# Patient Record
Sex: Female | Born: 1965 | Race: Black or African American | Hispanic: No | Marital: Married | Smoking: Current every day smoker
Health system: Southern US, Community
[De-identification: ages and names within clinical notes are randomized; demographics above are authoritative.]

## PROBLEM LIST (undated history)

## (undated) DIAGNOSIS — A599 Trichomoniasis, unspecified: Secondary | ICD-10-CM

## (undated) DIAGNOSIS — R51 Headache: Secondary | ICD-10-CM

## (undated) DIAGNOSIS — M51369 Other intervertebral disc degeneration, lumbar region without mention of lumbar back pain or lower extremity pain: Secondary | ICD-10-CM

## (undated) HISTORY — PX: TUBAL LIGATION: SHX77

## (undated) HISTORY — PX: WISDOM TOOTH EXTRACTION: SHX21

---

## 1998-10-05 DIAGNOSIS — F329 Major depressive disorder, single episode, unspecified: Secondary | ICD-10-CM

## 2009-11-10 ENCOUNTER — Inpatient Hospital Stay (HOSPITAL_COMMUNITY): Admission: EM | Admit: 2009-11-10 | Discharge: 2009-11-13 | Payer: Self-pay | Admitting: Emergency Medicine

## 2009-11-12 ENCOUNTER — Encounter (INDEPENDENT_AMBULATORY_CARE_PROVIDER_SITE_OTHER): Payer: Self-pay | Admitting: Nurse Practitioner

## 2009-11-12 ENCOUNTER — Ambulatory Visit: Payer: Self-pay | Admitting: Oncology

## 2009-11-13 ENCOUNTER — Encounter (INDEPENDENT_AMBULATORY_CARE_PROVIDER_SITE_OTHER): Payer: Self-pay | Admitting: Nurse Practitioner

## 2009-11-13 ENCOUNTER — Ambulatory Visit: Payer: Self-pay | Admitting: Oncology

## 2009-11-17 DIAGNOSIS — J13 Pneumonia due to Streptococcus pneumoniae: Secondary | ICD-10-CM | POA: Insufficient documentation

## 2009-11-29 ENCOUNTER — Ambulatory Visit: Payer: Self-pay | Admitting: Nurse Practitioner

## 2009-11-29 DIAGNOSIS — D61818 Other pancytopenia: Secondary | ICD-10-CM | POA: Insufficient documentation

## 2009-11-29 DIAGNOSIS — F17200 Nicotine dependence, unspecified, uncomplicated: Secondary | ICD-10-CM | POA: Insufficient documentation

## 2009-12-02 ENCOUNTER — Encounter (INDEPENDENT_AMBULATORY_CARE_PROVIDER_SITE_OTHER): Payer: Self-pay | Admitting: Nurse Practitioner

## 2009-12-02 LAB — CONVERTED CEMR LAB
Albumin: 4.6 g/dL (ref 3.5–5.2)
Alkaline Phosphatase: 53 units/L (ref 39–117)
CO2: 26 meq/L (ref 19–32)
Calcium: 9.8 mg/dL (ref 8.4–10.5)
Chloride: 106 meq/L (ref 96–112)
Creatinine, Ser: 0.84 mg/dL (ref 0.40–1.20)
Hemoglobin: 12.7 g/dL (ref 12.0–15.0)
Lymphs Abs: 1.7 10*3/uL (ref 0.7–4.0)
MCHC: 34 g/dL (ref 30.0–36.0)
MCV: 93.5 fL (ref 78.0–100.0)
Neutro Abs: 0.6 10*3/uL — ABNORMAL LOW (ref 1.7–7.7)
Neutrophils Relative %: 20 % — ABNORMAL LOW (ref 43–77)
Platelets: 250 10*3/uL (ref 150–400)
RBC: 3.99 M/uL (ref 3.87–5.11)
RDW: 12.8 % (ref 11.5–15.5)
Sodium: 140 meq/L (ref 135–145)
Total Bilirubin: 0.5 mg/dL (ref 0.3–1.2)
WBC: 2.8 10*3/uL — ABNORMAL LOW (ref 4.0–10.5)

## 2009-12-25 ENCOUNTER — Ambulatory Visit: Payer: Self-pay | Admitting: Oncology

## 2009-12-26 ENCOUNTER — Encounter (INDEPENDENT_AMBULATORY_CARE_PROVIDER_SITE_OTHER): Payer: Self-pay | Admitting: Nurse Practitioner

## 2009-12-26 LAB — MORPHOLOGY

## 2009-12-26 LAB — CBC & DIFF AND RETIC
BASO%: 0.9 % (ref 0.0–2.0)
HGB: 12.1 g/dL (ref 11.6–15.9)
Immature Retic Fract: 3.1 % (ref 0.00–10.70)
MCHC: 35 g/dL (ref 31.5–36.0)
MONO#: 0.3 10*3/uL (ref 0.1–0.9)
MONO%: 8.8 % (ref 0.0–14.0)
NEUT#: 1.1 10*3/uL — ABNORMAL LOW (ref 1.5–6.5)
Platelets: 223 10*3/uL (ref 145–400)
Retic %: 1.11 % (ref 0.50–1.50)
WBC: 3.5 10*3/uL — ABNORMAL LOW (ref 3.9–10.3)
nRBC: 0 % (ref 0–0)

## 2009-12-26 LAB — LACTATE DEHYDROGENASE: LDH: 122 U/L (ref 94–250)

## 2009-12-30 ENCOUNTER — Encounter (INDEPENDENT_AMBULATORY_CARE_PROVIDER_SITE_OTHER): Payer: Self-pay | Admitting: Nurse Practitioner

## 2010-01-01 ENCOUNTER — Ambulatory Visit: Payer: Self-pay | Admitting: Internal Medicine

## 2010-01-01 ENCOUNTER — Encounter (INDEPENDENT_AMBULATORY_CARE_PROVIDER_SITE_OTHER): Payer: Self-pay | Admitting: Nurse Practitioner

## 2010-02-04 ENCOUNTER — Telehealth (INDEPENDENT_AMBULATORY_CARE_PROVIDER_SITE_OTHER): Payer: Self-pay | Admitting: Nurse Practitioner

## 2010-02-18 ENCOUNTER — Ambulatory Visit: Payer: Self-pay | Admitting: Nurse Practitioner

## 2010-02-18 DIAGNOSIS — A6000 Herpesviral infection of urogenital system, unspecified: Secondary | ICD-10-CM | POA: Insufficient documentation

## 2010-02-18 DIAGNOSIS — R519 Headache, unspecified: Secondary | ICD-10-CM | POA: Insufficient documentation

## 2010-02-18 DIAGNOSIS — K029 Dental caries, unspecified: Secondary | ICD-10-CM | POA: Insufficient documentation

## 2010-02-18 DIAGNOSIS — A599 Trichomoniasis, unspecified: Secondary | ICD-10-CM

## 2010-02-18 DIAGNOSIS — R51 Headache: Secondary | ICD-10-CM

## 2010-02-18 LAB — CONVERTED CEMR LAB
Glucose, Urine, Semiquant: NEGATIVE
KOH Prep: NEGATIVE
Ketones, urine, test strip: NEGATIVE
Nitrite: NEGATIVE
Rapid HIV Screen: NEGATIVE
Specific Gravity, Urine: >=1.03
pH: 5

## 2010-02-19 ENCOUNTER — Encounter (INDEPENDENT_AMBULATORY_CARE_PROVIDER_SITE_OTHER): Payer: Self-pay | Admitting: Nurse Practitioner

## 2010-02-19 LAB — CONVERTED CEMR LAB
Albumin: 4.4 g/dL (ref 3.5–5.2)
Basophils Absolute: 0 10*3/uL (ref 0.0–0.1)
CO2: 23 meq/L (ref 19–32)
Calcium: 8.8 mg/dL (ref 8.4–10.5)
Chlamydia, DNA Probe: NEGATIVE
Chloride: 105 meq/L (ref 96–112)
HDL: 48 mg/dL (ref >39)
LDL Cholesterol: 76 mg/dL (ref 0–99)
Lymphocytes Relative: 48 % — ABNORMAL HIGH (ref 12–46)
Lymphs Abs: 1.4 10*3/uL (ref 0.7–4.0)
MCHC: 32.8 g/dL (ref 30.0–36.0)
MCV: 94 fL (ref 78.0–100.0)
Neutro Abs: 1 10*3/uL — ABNORMAL LOW (ref 1.7–7.7)
Neutrophils Relative %: 35 % — ABNORMAL LOW (ref 43–77)
Platelets: 164 10*3/uL (ref 150–400)
Potassium: 3.8 meq/L (ref 3.5–5.3)
RBC: 4.02 M/uL (ref 3.87–5.11)
Sodium: 139 meq/L (ref 135–145)
TSH: 0.897 microintl units/mL (ref 0.350–4.500)
Total CHOL/HDL Ratio: 2.8
VLDL: 12 mg/dL (ref 0–40)
WBC: 2.9 10*3/uL — ABNORMAL LOW (ref 4.0–10.5)

## 2010-02-25 ENCOUNTER — Encounter (INDEPENDENT_AMBULATORY_CARE_PROVIDER_SITE_OTHER): Payer: Self-pay | Admitting: Nurse Practitioner

## 2010-03-06 ENCOUNTER — Ambulatory Visit: Payer: Self-pay | Admitting: Nurse Practitioner

## 2010-03-07 ENCOUNTER — Ambulatory Visit (HOSPITAL_COMMUNITY): Admission: RE | Admit: 2010-03-07 | Discharge: 2010-03-07 | Payer: Self-pay | Admitting: Internal Medicine

## 2010-03-18 ENCOUNTER — Ambulatory Visit: Payer: Self-pay | Admitting: Nurse Practitioner

## 2010-03-18 DIAGNOSIS — M5380 Other specified dorsopathies, site unspecified: Secondary | ICD-10-CM

## 2010-11-04 NOTE — Letter (Signed)
Summary: PT INFORMATION SHEET  PT INFORMATION SHEET   Imported By: Arta Bruce 01/28/2010 14:21:33  _____________________________________________________________________  External Attachment:    Type:   Image     Comment:   External Document

## 2010-11-04 NOTE — Progress Notes (Signed)
Summary: Office Visit//DEPRESSION SCREENING  Office Visit//DEPRESSION SCREENING   Imported By: Arta Bruce 04/11/2010 12:45:41  _____________________________________________________________________  External Attachment:    Type:   Image     Comment:   External Document

## 2010-11-04 NOTE — Letter (Signed)
Summary: RETASURE  RETASURE   Imported By: Arta Bruce 02/04/2010 09:25:00  _____________________________________________________________________  External Attachment:    Type:   Image     Comment:   External Document

## 2010-11-04 NOTE — Letter (Signed)
Summary: Handout Printed  Printed Handout:  - Genital Herpes 

## 2010-11-04 NOTE — Miscellaneous (Signed)
Summary: Dx update  Clinical Lists Changes  Problems: Changed problem from PANCYTOPENIA (ICD-284.1) to PANCYTOPENIA (ICD-284.1) - hematology consult:12/26/2009 - pancytopenia r/t viral illness/pneumonia and has recovered.  no need for f/u but will see again if necessary (Amy Allyson Sabal, PA-C)

## 2010-11-04 NOTE — Letter (Signed)
Summary: Handout Printed  Printed Handout:  - Headaches, Analgesic Rebound, NHF

## 2010-11-04 NOTE — Letter (Signed)
Summary: [facility]/OFFICE NOTE  [facility]/OFFICE NOTE   Imported By: Arta Bruce 02/27/2010 14:46:07  _____________________________________________________________________  External Attachment:    Type:   Image     Comment:   External Document

## 2010-11-04 NOTE — Letter (Signed)
Summary: Heather Donaldson'S SUMMARY  Heather Donaldson'S SUMMARY   Imported By: Arta Bruce 06/04/2010 15:55:14  _____________________________________________________________________  External Attachment:    Type:   Image     Comment:   External Document

## 2010-11-04 NOTE — Letter (Signed)
Summary: *HSN Results Follow up  [facility]  [address] [place], [place] [address]   Phone: [phone]  Fax: [phone]      12/02/2009   [name] [address] [place], [place]  [address]   Dear  Ms. Felton Clinton,                            ____S.Drinkard,FNP   ____D. Gore,FNP       ____B. McPherson,MD   ____V. Rankins,MD    ____E. Mulberry,MD    _X___N. Daphine Deutscher, FNP  ____D. Reche Dixon, MD    ____K. Philipp Deputy, MD    ____Other     This letter is to inform you that your recent test(s):  _______Pap Smear    ___X____Lab Test     _______X-ray   Comments:  Your white blood cells are still low.  Keep your appointment with the specialist in March as ordered while in the hospital.  it is very important to find out why your blood count is so low.       _________________________________________________________ If you have any questions, please contact our office [phone].                    Sincerely,    Lehman Prom FNP [facility]

## 2010-11-04 NOTE — Letter (Signed)
Summary: Handout Printed  Printed Handout:  - Trichomonas 

## 2010-11-04 NOTE — Assessment & Plan Note (Signed)
Summary: NEW - Establish Care   Vital Signs:  Patient profile:   45 year old female LMP:     11/27/2009 Height:      64.50 inches Weight:      142.1 pounds BMI:     24.10 BSA:     1.70 Temp:     98.0 degrees F oral Pulse rate:   101 / minute Pulse rhythm:   regular Resp:     20 per minute BP sitting:   102 / 69  (left arm) Cuff size:   regular  Vitals Entered By: Levon Hedger (November 29, 2009 3:28 PM) CC: follow-up visit Heather Donaldson Is Patient Diabetic? No Pain Assessment Patient in pain? no       Does patient need assistance? Functional Status Self care Ambulation Normal LMP (date): 11/27/2009     Enter LMP: 11/27/2009   CC:  follow-up visit Heather Donaldson.  History of Present Illness:  Pt into the office to establish care No previous PCP  PMH - none PSH - tubal ligation, c-section x 3  Hospitalized from 11/10/2009 to 11/13/2009 with pneumonia. On day of admission pt had diarrhea, cough, fatigue and fever Dx with lower lobe pneumonia - inpt treatment was rocephin and azithromycin.  pt was tapered to doxycycline which pt reports she has finished. Cough still lingers but fever has gone. No more nausea, vomiting, diarrhea D-dimer was positive in ER and CT done which was negative for PE but did confirm the Penumonia.  Pancytopenia - likely a sequelae of viral illness Pt made an appt with Dr. Raymond Gurney Magrinat - March 10th at 2:45pm WBC 1.9, plt 98, hgb 12.5 on admission and wbc 1.5 with plt 93 on day of d/c with no pcp for many years pt had no previous knowledge of this dx  Social - pt relocated from [facility], [place] and has not established with a PCP since mofing to this area  Habits & Providers  Alcohol-Tobacco-Diet     Alcohol drinks/day: 0     Tobacco Status: current     Tobacco Counseling: to quit use of tobacco products     Cigarette Packs/Day: 1.0     Year Started: age 79  Exercise-Depression-Behavior     Does Patient Exercise: no     Drug Use:  past  Comments: Previous cocaine abuse - quit 5 years ago  Medications Prior to Update: 1)  None  Allergies (verified): 1)  ! 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 Eyes:  Complains of blurring; needs an eye exam. Resp:  Complains of cough; denies shortness of breath and wheezing. GI:  Denies abdominal pain, nausea, and vomiting. GU:  Denies discharge.  Physical Exam  General:  alert.   Head:  normocephalic.   Mouth:  fair dentition.   Lungs:  normal breath sounds.   no rhonchi Heart:  normal rate and regular rhythm.   Abdomen:  normal bowel sounds.   Neurologic:  alert & oriented X3 and gait normal.     Impression & Recommendations:  Problem # 1:  Hx of PNEUMONIA, LEFT LOWER LOBE (ICD-481) improved pt may  take otc cough meds  Problem # 2:  PANCYTOPENIA (ICD-284.1) keep f/u with hematology Orders: T-Comprehensive Metabolic Panel ([id number]) T-CBC w/Diff ([address]11-91478)  Problem # 3:  TOBACCO ABUSE (ICD-305.1) advised cessation Orders: T-Comprehensive Metabolic Panel ([address]-13086) T-CBC w/Diff (57[address]-96295)  Patient Instructions: 1)  Schedule a retasure exam on March 25th - the same day as eligibility. 2)  Keep your appointment with the blood specialist to find out why your blood count and platelets are so low. 3)  Schedule an appointment for a complete physical exam when you get your orange card. 4)  Come fasting for that appointment - do not eat after midnight before this visit.  May consider meds for smoking cessation   CT of Chest  Procedure date:  11/10/2009  Findings:      no evidence of  pulmonary embolus left lower lobe pneumonia low density prominence of both adrenal glands likely reflecting adrenal hyperplasia

## 2010-11-04 NOTE — Assessment & Plan Note (Signed)
Summary: Headaches   Vital Signs:  Patient profile:   45 year old female Menstrual status:  irregular Weight:      144.0 pounds BMI:     24.42 Temp:     97.8 degrees F oral Pulse rate:   76 / minute Pulse rhythm:   regular Resp:     20 per minute BP sitting:   106 / 73  (left arm) Cuff size:   regular  Vitals Entered By: Levon Hedger (March 18, 2010 11:17 AM) CC: follow-up visit 4 weeks....back spasms x 1 day that is causing some discomfort, Depression Is Patient Diabetic? No Pain Assessment Patient in pain? no       Does patient need assistance? Functional Status Self care Ambulation Normal   CC:  follow-up visit 4 weeks....back spasms x 1 day that is causing some discomfort and Depression.  History of Present Illness:  Pt into the office for follow up on headaches Daughter present today with pt during visit.  Headaches - see previous note for full H&P This does not seem to be the top priority for the pt as she admits that she has NOT decreased her caffiene intake as ordered by the provider.   She does reports headaches have lessened since last visit  Left middle back - started yesterday with muscle spasm still present at this time which made sleep difficult on last night Previous history of back spasms but last was about 6-8 months ago   Habits & Providers  Alcohol-Tobacco-Diet     Alcohol drinks/day: 0     Tobacco Status: current     Tobacco Counseling: to quit use of tobacco products     Cigarette Packs/Day: 1.0     Year Started: age 8  Exercise-Depression-Behavior     Does Patient Exercise: no     Have you felt down or hopeless? yes     Have you felt little pleasure in things? yes     Depression Counseling: not indicated; screening negative for depression     Drug Use: past  Comments: Pt has scheduled an appointment with [facility]  Allergies (verified): 1)  ! Aspirin  Review of Systems General:  Denies fever. CV:  Denies chest pain or  discomfort. Resp:  Denies cough. GI:  Denies abdominal pain, nausea, and vomiting. MS:  Complains of cramps; left middle back.  Physical Exam  General:  alert.   Head:  normocephalic.   Eyes:  glasses Mouth:  poor dentation Lungs:  normal breath sounds.   Heart:  normal rate and regular rhythm.   Msk:  tenderness with palpation of upper posterior chest Neurologic:  alert & oriented X3.     Impression & Recommendations:  Problem # 1:  HEADACHE (ICD-784.0) decreased though pt has not decreased her caffiene intake as advised by this provider Her updated medication list for this problem includes:    Naproxen 500 Mg Tabs (Naproxen) ..... One tablet by mouth two times a day as needed for pain  Problem # 2:  MUSCLE SPASM, BACK (ICD-724.8) warm compresses stretches Her updated medication list for this problem includes:    Naproxen 500 Mg Tabs (Naproxen) ..... One tablet by mouth two times a day as needed for pain    Cyclobenzaprine Hcl 10 Mg Tabs (Cyclobenzaprine hcl) ..... One tablet by mouth nightly as needed for muscles  Complete Medication List: 1)  Valtrex 500 Mg Tabs (Valacyclovir hcl) .... 2 gm by mouth every 12 hours for 2 doses 2)  Naproxen 500 Mg Tabs (Naproxen) .... One tablet by mouth two times a day as needed for pain 3)  Cyclobenzaprine Hcl 10 Mg Tabs (Cyclobenzaprine hcl) .... One tablet by mouth nightly as needed for muscles  Other Orders: Tdap => 66yrs IM [phone]) Admin 1st Vaccine (62952) Admin 1st Vaccine [facility]) [id number])  Patient Instructions: 1)  Change your appointmetn with [facility] for a date that is better for you. 2)  Left side pain may be due to muscle spasms.  Take the muscle relaxer as needed at night.  This will make you sleepy. 3)  Avoid bending and lifting. 4)  May also take naprosyn as needed during the day  5)  You have received you tetanus today.  This will be good for 10 years. Prescriptions: CYCLOBENZAPRINE HCL 10 MG TABS (CYCLOBENZAPRINE  HCL) One tablet by mouth nightly as needed for muscles  #30 x 0   Entered and Authorized by:   Lehman Prom FNP   Signed by:   Lehman Prom FNP on 03/18/2010   Method used:   Print then Give to Patient   RxID:   [id number]    Orders Added: 1)  Est. Patient Level III [74259] 2)  Tdap => 1yrs IM [56387] 3)  Admin 1st Vaccine [90471] 4)  Admin 1st Vaccine [facility]) [56433I]    Tetanus/Td Vaccine    Vaccine Type: Tdap    Site: left deltoid    Mfr: Sanofi Pasteur    Dose: 0.5 ml    Route: IM    Given by: Levon Hedger    Exp. Date: 08/06/2012    Lot #: [id number]    VIS given: 08/23/07 version given March 18, 2010.

## 2010-11-04 NOTE — Letter (Signed)
Summary: Discharge Summary  Discharge Summary   Imported By: Arta Bruce 01/29/2010 14:29:17  _____________________________________________________________________  External Attachment:    Type:   Image     Comment:   External Document

## 2010-11-04 NOTE — Consult Note (Signed)
Summary: Consultation Report//DR.JARRETT  Consultation Report//DR.JARRETT   Imported By: Arta Bruce 01/29/2010 14:27:15  _____________________________________________________________________  External Attachment:    Type:   Image     Comment:   External Document

## 2010-11-04 NOTE — Progress Notes (Signed)
Summary: Retasure  Phone Note [employer] of Call: notify pt that retasure eye screening was abnormal.  she will need to see an eye doctor for further testing to rule out glaucoma. She can find her own provider or this office can refer her to Dr. Mitzi Davenport which charges a [phone] co-payment another option would be [facility] [place] [phone] (pt will need to call) Initial call taken by: Lehman Prom FNP,  Feb 04, 2010 8:36 AM  Follow-up for Phone Call        pt was seen by Dr. Dimas Aguas ?? on [facility]. Pt. does not have glaucoma however needs glasses, which she will get through the [facility]. Follow-up by: Gaylyn Cheers RN,  Feb 04, 2010 10:36 AM     Diabetes Management History:      She says that she is not exercising regularly.    Diabetes Management Exam:    Eye Exam:       Eye Exam done elsewhere          Date: 01/01/2010          Results: ? glaucoma          Done by: Colonel Bald

## 2010-11-04 NOTE — Assessment & Plan Note (Signed)
Summary: Complete Physical Exam   Vital Signs:  Patient profile:   45 year old female Menstrual status:  irregular LMP:     02/15/2010 Weight:      143.1 pounds BMI:     24.27 BSA:     1.71 Temp:     97.7 degrees F oral Pulse rate:   85 / minute Pulse rhythm:   regular BP sitting:   115 / 78  (left arm) Cuff size:   regular  Vitals Entered By: Levon Hedger (Feb 18, 2010 9:26 AM) CC: CPP, Depression, Headache Is Patient Diabetic? No Pain Assessment Patient in pain? no       Does patient need assistance? Functional Status Self care Ambulation Normal LMP (date): 02/15/2010 LMP - Character: normal     Menstrual Status irregular Enter LMP: 02/15/2010   CC:  CPP, Depression, and Headache.  History of Present Illness:  Pt into the office for a complete physical exam  PAP - Last PAP was MANY years ago.  Hx of abnormal PAP 23 years ago.  Pt did go to [facility] and have procedure.  No F/u since then.  S/p tubal ligation No family history cervical or ovarian cancer (Pt was adopted by her paternal grandparents) Menses - monthly and sometimes twice per month  Mammogram - no family history of breast cancer Last mammogram at age 65  Optho -  wear glasses.  last eye exam was 2 weeks ago.  Dental - no recent dental exam  Daughter present today with pt   Depression History:      The patient denies recurrent thoughts of death or suicide.        The patient denies that she feels like life is not worth living, denies that she wishes that she were dead, and denies that she has thought about ending her life.        Comments:  hx suicide attempt over 10 years ago. she was started on meds at that time but has not continued over the years.  Headache HPI:      She has approximately 5+ headaches per month.  Headaches have been occurring since age 76.        The location of the headaches are bilateral.  Headache quality is throbbing or pulsating.        The patient denies confusion  and seizures.        Additional history: Drinks about 6 pepsi per day.          Habits & Providers  Alcohol-Tobacco-Diet     Alcohol drinks/day: 0     Tobacco Status: current     Tobacco Counseling: to quit use of tobacco products     Cigarette Packs/Day: 1.0     Year Started: age 5  Exercise-Depression-Behavior     Does Patient Exercise: no     Have you felt down or hopeless? yes     Have you felt little pleasure in things? yes     Depression Counseling: not indicated; screening negative for depression     Drug Use: past  Comments: PHQ-9 score = 6  Medications Prior to Update: 1)  None  Allergies (verified): 1)  ! Aspirin  Review of Systems General:  hot flashes, night sweats, mood swings. Eyes:  Denies blurring. ENT:  Denies earache. CV:  Denies chest pain or discomfort. Resp:  Denies cough. GI:  Denies abdominal pain. GU:  Denies discharge. MS:  Denies joint pain. Derm:  Denies rash; herpes outbreak -  last 2 weeks ago.  usually to buttocks.  Treated many years ago with valcyclovir.  Usual oubreaks every 6-8 months.  No meds. just self resolve.  Areas start as blisters, that drain then crust.  Painful. Neuro:  Complains of headaches.  Physical Exam  General:  alert.   Head:  normocephalic.   Eyes:  pupils round.   Ears:  bil TM with bony landmarks present Nose:  no nasal discharge.   Mouth:  poor dentation multiple dental caries Neck:  supple.   Chest Wall:  no mass.   Breasts:  no masses.   Lungs:  normal breath sounds.   Heart:  normal rate and regular rhythm.   Abdomen:  soft, non-tender, and normal bowel sounds.   Rectal:  no external abnormalities.   Msk:  up to the exam table Pulses:  R radial normal and L radial normal.   Extremities:  no edema Neurologic:  gait normal.   Skin:  color normal.   Psych:  Oriented X3.    Pelvic Exam  Vulva:      normal appearance.   Urethra and Bladder:      Urethra--no discharge.   Vagina:      copious  discharge.   Cervix:      midposition, tender.   Uterus:      smooth.   Adnexa:      nontender bilaterally.   Rectum:      normal, heme negative stool.      Impression & Recommendations:  Problem # 1:  ROUTINE GYNECOLOGICAL EXAMINATION (ICD-V72.31) labs done PAP done rec optho and dental exam EKG done Orders: KOH/ WET Mount [phone]) Pap Smear, Thin Prep ( Collection of) [phone]) UA Dipstick w/o Micro (manual) (14782) EKG w/ Interpretation (93000) T-Lipid Profile (95621-30865) T-CBC w/Diff (78469-62952) Rapid HIV  (92370) T-Syphilis Test (RPR) (84132-44010) T-TSH (27253-66440) T- GC Chlamydia (34742)  Problem # 2:  UNSPECIFIED BREAST SCREENING (ICD-V76.10) self screening placcard given will schedule mammogram Orders: Mammogram (Screening) (Mammo)  Problem # 3:  TOBACCO ABUSE (ICD-305.1) advised cessation  Problem # 4:  DEPRESSION (ICD-311) will refer to [facility] Orders: Rapid HIV  (59563) T-TSH [phone]) Misc. Referral (Misc. 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 One tablet by mouth two times a day as needed for pain  Complete Medication List: 1)  Valtrex 500 Mg Tabs (Valacyclovir hcl) .... 2 gm by mouth every 12 hours for 2 doses 2)  Naproxen 500 Mg Tabs (Naproxen) .... One tablet by mouth two times a day as needed for pain 3)  Metronidazole 500 Mg Tabs (Metronidazole) .... 4 tablets by mouth x 1 dose  Patient Instructions: 1)  Refer to [facility] for couseling 2)  You will also be referred to the dental clinic.  You  will be notified of the time/date of the appointment directly from the dental clinic 3)  Herpes - take valtrex at first sign of outbreak.  This does NOT cure the virus.  It only lesses the time you will have the outbreak. 4)  Headache - may  actually be caffiene induced.  If you drink Pepsi - 6 cans per day you will need to decrease over the next 2 weeks. 5)  Slowly decrease to 5 per day, then 4 per day then 3 per day down to 2 per day.  Keep in mind that you will have a rebound headache as your body is used to the caffiene but you really need to quit drinking so many pepsi's.  If you STOP all suddenly then you will have a major headache so that is why it is best to taper.  May take naprosyn 500mg  by mouth two times a day as needed for headache.  If necessary, you can drink Caffiene FREE pepsi but i would advise you taper off the soda to water or juice.  No TEA because this has caffiene as well 6)  Follow up in 4 weeks for headache assessement.  7)  will also need tetanus  Prescriptions: METRONIDAZOLE 500 MG TABS (METRONIDAZOLE) 4 tablets by mouth x 1 dose  #4 x 0   Entered and Authorized by:   Lehman Prom FNP   Signed by:   Lehman Prom FNP on 02/18/2010   Method used:   Print then Give to Patient   RxID:   [id number] NAPROXEN 500 MG TABS (NAPROXEN) One tablet by mouth two times a day as needed for pain  #50 x 0   Entered and Authorized by:   Lehman Prom FNP   Signed by:   Lehman Prom FNP on 02/18/2010   Method used:   Print then Give to Patient   RxID:   [id number] VALTREX 500 MG TABS (VALACYCLOVIR HCL) 2 gm by mouth every 12 hours for 2 doses  #8 x 0   Entered and Authorized by:   Lehman Prom FNP   Signed by:   Lehman Prom FNP on 02/18/2010   Method used:   Print then Give to Patient   RxID:   [id number]   Laboratory Results   Urine Tests  Date/Time Received: Feb 18, 2010 9:33 AM  Date/Time Reported: Feb 18, 2010 9:33 AM   Routine Urinalysis     Color: lt. yellow Appearance: Clear Glucose: negative   (Normal Range: Negative) Bilirubin: small   (Normal Range: Negative) Ketone: negative   (Normal Range: Negative) Spec. Gravity: >=1.030   (Normal Range: 1.003-1.035) Blood: trace-lysed   (Normal Range: Negative) pH: 5.0   (Normal Range: 5.0-8.0) Protein: trace   (Normal Range: Negative) Urobilinogen: 0.2   (Normal Range: 0-1) Nitrite: negative   (Normal Range: Negative) Leukocyte Esterace: trace   (Normal Range: Negative)    Date/Time Received: Feb 18, 2010   Wet Mount/KOH Source: vaginal WBC/hpf: 1-5 Bacteria/hpf: rare Clue cells/hpf: none Yeast/hpf: none Trichomonas/hpf: moderate  Other Tests  Rapid HIV: negative  Stool - Occult Blood Hemmoccult #1: negative Date: 02/18/2010      Prevention & Chronic Care Immunizations   Influenza vaccine: Not documented   Influenza vaccine deferral: Not available  (02/18/2010)    Tetanus booster: Not documented   Td booster deferral: Not available  (02/18/2010)    Pneumococcal vaccine: Not documented   Pneumococcal vaccine deferral: Not indicated  (02/18/2010)  Other Screening   Pap smear: Not documented   Pap smear action/deferral: Ordered  (02/18/2010)   Pap smear due: 02/19/2011    Mammogram: Not documented   Mammogram action/deferral: Ordered  (02/18/2010)   Smoking status: current  (02/18/2010)   Smoking cessation counseling: yes  (02/18/2010)  Lipids   Total Cholesterol:  Not documented   Lipid panel action/deferral: Lipid Panel ordered   LDL: Not documented   LDL Direct: Not documented   HDL: Not documented   Triglycerides: Not documented  Laboratory Results   Urine Tests    Routine Urinalysis   Color: lt. yellow Appearance: Clear Glucose: negative   (Normal Range: Negative) Bilirubin: small   (Normal Range: Negative) Ketone: negative   (Normal Range: Negative) Spec. Gravity: >=1.030   (Normal Range: 1.003-1.035) Blood: trace-lysed    (Normal Range: Negative) pH: 5.0   (Normal Range: 5.0-8.0) Protein: trace   (Normal Range: Negative) Urobilinogen: 0.2   (Normal Range: 0-1) Nitrite: negative   (Normal Range: Negative) Leukocyte Esterace: trace   (Normal Range: Negative)      Wet Mount Wet Mount KOH: Negative  Other Tests  Rapid HIV: negative  Stool - Occult Blood Hemmoccult #1: negative

## 2010-11-04 NOTE — Letter (Signed)
Summary: *HSN Results Follow up  [facility]  [address] [place], [place] [address]   Phone: [phone]  Fax: [phone]      02/25/2010   CARLISLE TORGESON [address] [place], [place]  [address]   Dear  Ms. Felton Clinton,                            ____S.Drinkard,FNP   ____D. Gore,FNP       ____B. McPherson,MD   ____V. Rankins,MD    ____E. Mulberry,MD    __X__N. Daphine Deutscher, FNP  ____D. Reche Dixon, MD    ____K. Philipp Deputy, MD    ____Other     This letter is to inform you that your recent test(s):  ___X____Pap Smear    _______Lab Test     _______X-ray    ___X____ is within acceptable limits  _______ requires a medication change  _______ requires a follow-up lab visit  _______ requires a follow-up visit with your provider   Comments: Pap Smear results normal.       _________________________________________________________ If you have any questions, please contact our office [phone].                    Sincerely,    Lehman Prom FNP [facility]

## 2010-11-04 NOTE — Letter (Signed)
Summary: DENTAL REFERRAL  DENTAL REFERRAL   Imported By: Arta Bruce 02/19/2010 09:10:21  _____________________________________________________________________  External Attachment:    Type:   Image     Comment:   External Document

## 2010-11-04 NOTE — Letter (Signed)
Summary: HEMATOLOGY/MEDICAL ONCOLOGY  HEMATOLOGY/MEDICAL ONCOLOGY   Imported By: Arta Bruce 02/27/2010 14:49:46  _____________________________________________________________________  External Attachment:    Type:   Image     Comment:   External Document

## 2010-12-25 LAB — DIFFERENTIAL
Basophils Absolute: 0 10*3/uL (ref 0.0–0.1)
Eosinophils Absolute: 0 10*3/uL (ref 0.0–0.7)
Eosinophils Relative: 0 % (ref 0–5)
Lymphocytes Relative: 35 % (ref 12–46)
Lymphs Abs: 0.6 10*3/uL — ABNORMAL LOW (ref 0.7–4.0)
Lymphs Abs: 0.8 10*3/uL (ref 0.7–4.0)
Monocytes Absolute: 0.4 10*3/uL (ref 0.1–1.0)
Monocytes Relative: 19 % — ABNORMAL HIGH (ref 3–12)
Neutro Abs: 0.6 10*3/uL — ABNORMAL LOW (ref 1.7–7.7)
Neutro Abs: 1 10*3/uL — ABNORMAL LOW (ref 1.7–7.7)
Neutrophils Relative %: 56 % (ref 43–77)

## 2010-12-25 LAB — BASIC METABOLIC PANEL
BUN: 12 mg/dL (ref 6–23)
BUN: 7 mg/dL (ref 6–23)
CO2: 21 mEq/L (ref 19–32)
CO2: 22 mEq/L (ref 19–32)
CO2: 24 mEq/L (ref 19–32)
Calcium: 8.2 mg/dL — ABNORMAL LOW (ref 8.4–10.5)
Calcium: 8.6 mg/dL (ref 8.4–10.5)
Creatinine, Ser: 0.69 mg/dL (ref 0.4–1.2)
GFR calc non Af Amer: >60 mL/min (ref >60)
Glucose, Bld: 100 mg/dL — ABNORMAL HIGH (ref 70–99)
Glucose, Bld: 100 mg/dL — ABNORMAL HIGH (ref 70–99)
Glucose, Bld: 112 mg/dL — ABNORMAL HIGH (ref 70–99)
Potassium: 3.3 mEq/L — ABNORMAL LOW (ref 3.5–5.1)
Potassium: 3.6 mEq/L (ref 3.5–5.1)
Sodium: 132 mEq/L — ABNORMAL LOW (ref 135–145)
Sodium: 136 mEq/L (ref 135–145)

## 2010-12-25 LAB — CBC
HCT: 25.5 % — ABNORMAL LOW (ref 36.0–46.0)
HCT: 29.1 % — ABNORMAL LOW (ref 36.0–46.0)
Hemoglobin: 11.2 g/dL — ABNORMAL LOW (ref 12.0–15.0)
Hemoglobin: 12.5 g/dL (ref 12.0–15.0)
Hemoglobin: 9.1 g/dL — ABNORMAL LOW (ref 12.0–15.0)
MCHC: 35.2 g/dL (ref 30.0–36.0)
MCHC: 35.7 g/dL (ref 30.0–36.0)
MCV: 93.2 fL (ref 78.0–100.0)
Platelets: 86 10*3/uL — ABNORMAL LOW (ref 150–400)
Platelets: 93 10*3/uL — ABNORMAL LOW (ref 150–400)
Platelets: 98 10*3/uL — ABNORMAL LOW (ref 150–400)
RDW: 12.5 % (ref 11.5–15.5)
RDW: 12.7 % (ref 11.5–15.5)
WBC: 1.5 10*3/uL — ABNORMAL LOW (ref 4.0–10.5)
WBC: 1.9 10*3/uL — ABNORMAL LOW (ref 4.0–10.5)

## 2010-12-25 LAB — CULTURE, BLOOD (ROUTINE X 2)

## 2010-12-25 LAB — TROPONIN I: Troponin I: 0.01 ng/mL (ref 0.00–0.06)

## 2010-12-25 LAB — POCT CARDIAC MARKERS: Myoglobin, poc: 95.4 ng/mL (ref 12–200)

## 2010-12-25 LAB — CARDIAC PANEL(CRET KIN+CKTOT+MB+TROPI)
CK, MB: 0.9 ng/mL (ref 0.3–4.0)
Relative Index: 0.3 (ref 0.0–2.5)

## 2010-12-25 LAB — COMPREHENSIVE METABOLIC PANEL
Albumin: 3.3 g/dL — ABNORMAL LOW (ref 3.5–5.2)
BUN: 6 mg/dL (ref 6–23)
Chloride: 110 mEq/L (ref 96–112)
Creatinine, Ser: 0.65 mg/dL (ref 0.4–1.2)
Glucose, Bld: 90 mg/dL (ref 70–99)
Total Bilirubin: 0.5 mg/dL (ref 0.3–1.2)

## 2010-12-25 LAB — RETICULOCYTES: Retic Ct Pct: 0.9 % (ref 0.4–3.1)

## 2010-12-25 LAB — TSH: TSH: 0.411 u[IU]/mL (ref 0.350–4.500)

## 2010-12-25 LAB — LIPID PANEL
Cholesterol: 123 mg/dL (ref 0–200)
HDL: 36 mg/dL — ABNORMAL LOW (ref >39)
LDL Cholesterol: 74 mg/dL (ref 0–99)
Triglycerides: 64 mg/dL (ref <150)

## 2010-12-25 LAB — CK TOTAL AND CKMB (NOT AT ARMC): Relative Index: 0.3 (ref 0.0–2.5)

## 2010-12-25 LAB — D-DIMER, QUANTITATIVE: D-Dimer, Quant: 1.33 ug/mL-FEU — ABNORMAL HIGH (ref 0.00–0.48)

## 2010-12-25 LAB — BRAIN NATRIURETIC PEPTIDE: Pro B Natriuretic peptide (BNP): <30 pg/mL (ref 0.0–100.0)

## 2010-12-25 LAB — HIV ANTIBODY (ROUTINE TESTING W REFLEX): HIV: NONREACTIVE

## 2010-12-25 LAB — SEDIMENTATION RATE: Sed Rate: 73 mm/hr — ABNORMAL HIGH (ref 0–22)

## 2012-01-03 ENCOUNTER — Emergency Department (HOSPITAL_COMMUNITY)
Admission: EM | Admit: 2012-01-03 | Discharge: 2012-01-03 | Disposition: A | Discharge status: Home / Self Care | Payer: Self-pay | Attending: Emergency Medicine | Admitting: Emergency Medicine

## 2012-01-03 ENCOUNTER — Emergency Department (HOSPITAL_COMMUNITY): Payer: Self-pay

## 2012-01-03 ENCOUNTER — Encounter (HOSPITAL_COMMUNITY): Payer: Self-pay | Admitting: Emergency Medicine

## 2012-01-03 DIAGNOSIS — N12 Tubulo-interstitial nephritis, not specified as acute or chronic: Secondary | ICD-10-CM | POA: Insufficient documentation

## 2012-01-03 DIAGNOSIS — M545 Low back pain, unspecified: Secondary | ICD-10-CM | POA: Insufficient documentation

## 2012-01-03 DIAGNOSIS — R0602 Shortness of breath: Secondary | ICD-10-CM | POA: Insufficient documentation

## 2012-01-03 DIAGNOSIS — N898 Other specified noninflammatory disorders of vagina: Secondary | ICD-10-CM | POA: Insufficient documentation

## 2012-01-03 DIAGNOSIS — F17200 Nicotine dependence, unspecified, uncomplicated: Secondary | ICD-10-CM | POA: Insufficient documentation

## 2012-01-03 DIAGNOSIS — R110 Nausea: Secondary | ICD-10-CM | POA: Insufficient documentation

## 2012-01-03 DIAGNOSIS — A64 Unspecified sexually transmitted disease: Secondary | ICD-10-CM | POA: Insufficient documentation

## 2012-01-03 DIAGNOSIS — R109 Unspecified abdominal pain: Secondary | ICD-10-CM | POA: Insufficient documentation

## 2012-01-03 DIAGNOSIS — R10814 Left lower quadrant abdominal tenderness: Secondary | ICD-10-CM | POA: Insufficient documentation

## 2012-01-03 DIAGNOSIS — A599 Trichomoniasis, unspecified: Secondary | ICD-10-CM | POA: Insufficient documentation

## 2012-01-03 LAB — CBC
Platelets: 154 10*3/uL (ref 150–400)
RBC: 3.44 MIL/uL — ABNORMAL LOW (ref 3.87–5.11)
WBC: 2.9 10*3/uL — ABNORMAL LOW (ref 4.0–10.5)

## 2012-01-03 LAB — URINALYSIS, ROUTINE W REFLEX MICROSCOPIC
Bilirubin Urine: NEGATIVE
Glucose, UA: NEGATIVE mg/dL
Hgb urine dipstick: NEGATIVE
Ketones, ur: NEGATIVE mg/dL
Protein, ur: NEGATIVE mg/dL

## 2012-01-03 LAB — COMPREHENSIVE METABOLIC PANEL
ALT: 7 U/L (ref 0–35)
Alkaline Phosphatase: 59 U/L (ref 39–117)
CO2: 25 mEq/L (ref 19–32)
Chloride: 105 mEq/L (ref 96–112)
GFR calc Af Amer: >90 mL/min (ref >90)
Glucose, Bld: 102 mg/dL — ABNORMAL HIGH (ref 70–99)
Potassium: 3.4 mEq/L — ABNORMAL LOW (ref 3.5–5.1)
Sodium: 138 mEq/L (ref 135–145)
Total Protein: 6.4 g/dL (ref 6.0–8.3)

## 2012-01-03 LAB — WET PREP, GENITAL
WBC, Wet Prep HPF POC: NONE SEEN
Yeast Wet Prep HPF POC: NONE SEEN

## 2012-01-03 LAB — DIFFERENTIAL
Eosinophils Absolute: 0 10*3/uL (ref 0.0–0.7)
Lymphocytes Relative: 23 % (ref 12–46)
Lymphs Abs: 0.7 10*3/uL (ref 0.7–4.0)
Neutro Abs: 1.8 10*3/uL (ref 1.7–7.7)
Neutrophils Relative %: 62 % (ref 43–77)

## 2012-01-03 LAB — URINE MICROSCOPIC-ADD ON

## 2012-01-03 MED ORDER — HYDROMORPHONE HCL PF 1 MG/ML IJ SOLN
1.0000 mg | Freq: Once | INTRAMUSCULAR | Status: AC
  Administered 2012-01-03: 1 mg via INTRAVENOUS
  Filled 2012-01-03: qty 1

## 2012-01-03 MED ORDER — SODIUM CHLORIDE 0.9 % IV BOLUS (SEPSIS)
1000.0000 mL | Freq: Once | INTRAVENOUS | Status: AC
  Administered 2012-01-03: 1000 mL via INTRAVENOUS

## 2012-01-03 MED ORDER — DIAZEPAM 5 MG/ML IJ SOLN
5.0000 mg | Freq: Once | INTRAMUSCULAR | Status: AC
  Administered 2012-01-03: 5 mg via INTRAVENOUS
  Filled 2012-01-03: qty 2

## 2012-01-03 MED ORDER — METRONIDAZOLE 500 MG PO TABS
2000.0000 mg | ORAL_TABLET | Freq: Once | ORAL | Status: AC
  Administered 2012-01-03: 2000 mg via ORAL
  Filled 2012-01-03: qty 3
  Filled 2012-01-03: qty 1

## 2012-01-03 MED ORDER — CIPROFLOXACIN HCL 500 MG PO TABS: 500.0000 mg | ORAL_TABLET | Freq: Two times a day (BID) | ORAL | Status: AC

## 2012-01-03 MED ORDER — DEXTROSE 5 % IV SOLN
1.0000 g | Freq: Once | INTRAVENOUS | Status: AC
  Administered 2012-01-03: 1 g via INTRAVENOUS
  Filled 2012-01-03: qty 10

## 2012-01-03 MED ORDER — ONDANSETRON HCL 4 MG/2ML IJ SOLN
4.0000 mg | Freq: Once | INTRAMUSCULAR | Status: AC
  Administered 2012-01-03: 4 mg via INTRAVENOUS
  Filled 2012-01-03: qty 2

## 2012-01-03 NOTE — ED Notes (Signed)
Patient is alert and oriented x3.  She was given DC instructions and follow up visit instructions.  Patient gave verbal understanding. She was DC ambulatory under his own power to home.  V/S stable.  He was not showing any signs of distress on DC 

## 2012-01-03 NOTE — ED Provider Notes (Signed)
6:30am Report received from [place].  Will dispo patient when labs come back. Pt received rocephen and flagyl in the ER for trichamonias and UTI with possible kidney infection.  RX for cipro.  Follow up with pcp.  Jethro Bastos, NP 01/03/12 1931

## 2012-01-03 NOTE — ED Provider Notes (Signed)
History     CSN: [id number]  Arrival date & time 01/03/12  0143   First MD Initiated Contact with Patient 01/03/12 [phone]      Chief Complaint  Patient presents with  . Back Pain  . Flank Pain  . Shortness of Breath    HPI  History provided by the patient. Patient is a 46 year old female with past history of cesarean section who presents with complaints of back and abdominal pain is increased for the past 2 weeks. Pain is increased gradually and today is described as very severe in all positions. Pain is worse in the left flank and abdomen area. Patient denies any aggravating or alleviating factors. Patient has not taken anything for her pain. Pain has been associated with some nausea the patient denies any vomiting. She denies any fever, chills, sweats, dysuria, hematuria or urinary frequency. Patient denies any vaginal discharge or vaginal bleeding. Patient denies similar symptoms previously patient denies history of kidney stones. Patient denies alcohol use.    History reviewed. No pertinent past medical history.  Past Surgical History  Procedure Date  . Cesarean section     History reviewed. No pertinent family history.  History  Substance Use Topics  . Smoking status: Current Everyday Smoker  . Smokeless tobacco: Not on file  . Alcohol Use: No    OB History    Grav Para Term Preterm Abortions TAB SAB Ect Mult Living                  Review of Systems  Constitutional: Positive for appetite change. Negative for fever and chills.  Respiratory: Negative for cough and shortness of breath.   Gastrointestinal: Positive for nausea and abdominal pain. Negative for vomiting, diarrhea and constipation.  Genitourinary: Negative for dysuria, frequency, hematuria, flank pain, vaginal bleeding and vaginal discharge.  Musculoskeletal: Positive for back pain.    Allergies  Aspirin  Home Medications  No current outpatient prescriptions on file.  BP 107/69  Pulse 86   Temp(Src) 98.4 F (36.9 C) (Oral)  Resp 18  SpO2 94%  LMP 12/04/2011  Physical Exam  Nursing note and vitals reviewed. Constitutional: She is oriented to person, place, and time. She appears well-developed and well-nourished. No distress.  HENT:  Head: Normocephalic.  Neck:       No meningeal sign  Cardiovascular: Normal rate and regular rhythm.   Pulmonary/Chest: Effort normal and breath sounds normal. No respiratory distress. She has no wheezes.  Abdominal: Soft. There is no hepatosplenomegaly. There is CVA tenderness. There is no rebound, no guarding, no tenderness at McBurney's point and negative Murphy's sign.       Diffuse moderate tenderness to palpation greatest in the left upper quadrant. Patient also with possible. Left CVA tenderness. Though she is tender diffusely on back  Genitourinary: Uterus is not enlarged and not tender. Cervix exhibits discharge and friability. Cervix exhibits no motion tenderness. Right adnexum displays no mass, no tenderness and no fullness. Left adnexum displays no mass, no tenderness and no fullness.       Chaperone was present. Moderate amount of white discharge.  Musculoskeletal:       Back:       Tenderness along bilateral lower back  Neurological: She is alert and oriented to person, place, and time.  Skin: Skin is warm and dry. No rash noted.  Psychiatric: She has a normal mood and affect. Her behavior is normal.    ED Course  Procedures   Results for orders  placed during the hospital encounter of 01/03/12  URINALYSIS, ROUTINE W REFLEX MICROSCOPIC      Component Value Range   Color, Urine YELLOW  YELLOW    APPearance CLOUDY (*) CLEAR    Specific Gravity, Urine 1.018  1.005 - 1.030    pH 7.0  5.0 - 8.0    Glucose, UA NEGATIVE  NEGATIVE (mg/dL)   Hgb urine dipstick NEGATIVE  NEGATIVE    Bilirubin Urine NEGATIVE  NEGATIVE    Ketones, ur NEGATIVE  NEGATIVE (mg/dL)   Protein, ur NEGATIVE  NEGATIVE (mg/dL)   Urobilinogen, UA 2.0 (*) 0.0  - 1.0 (mg/dL)   Nitrite POSITIVE (*) NEGATIVE    Leukocytes, UA SMALL (*) NEGATIVE   CBC      Component Value Range   WBC 2.9 (*) 4.0 - 10.5 (K/uL)   RBC 3.44 (*) 3.87 - 5.11 (MIL/uL)   Hemoglobin 10.7 (*) 12.0 - 15.0 (g/dL)   HCT 40.9 (*) 81.1 - 46.0 (%)   MCV 89.5  78.0 - 100.0 (fL)   MCH 31.1  26.0 - 34.0 (pg)   MCHC 34.7  30.0 - 36.0 (g/dL)   RDW 91.4  78.2 - 95.6 (%)   Platelets 154  150 - 400 (K/uL)  DIFFERENTIAL      Component Value Range   Neutrophils Relative 62  43 - 77 (%)   Neutro Abs 1.8  1.7 - 7.7 (K/uL)   Lymphocytes Relative 23  12 - 46 (%)   Lymphs Abs 0.7  0.7 - 4.0 (K/uL)   Monocytes Relative 15 (*) 3 - 12 (%)   Monocytes Absolute 0.4  0.1 - 1.0 (K/uL)   Eosinophils Relative 1  0 - 5 (%)   Eosinophils Absolute 0.0  0.0 - 0.7 (K/uL)   Basophils Relative 0  0 - 1 (%)   Basophils Absolute 0.0  0.0 - 0.1 (K/uL)  COMPREHENSIVE METABOLIC PANEL      Component Value Range   Sodium 138  135 - 145 (mEq/L)   Potassium 3.4 (*) 3.5 - 5.1 (mEq/L)   Chloride 105  96 - 112 (mEq/L)   CO2 25  19 - 32 (mEq/L)   Glucose, Bld 102 (*) 70 - 99 (mg/dL)   BUN 12  6 - 23 (mg/dL)   Creatinine, Ser 2.13  0.50 - 1.10 (mg/dL)   Calcium 8.6  8.4 - 08.6 (mg/dL)   Total Protein 6.4  6.0 - 8.3 (g/dL)   Albumin 3.6  3.5 - 5.2 (g/dL)   AST 14  0 - 37 (U/L)   ALT 7  0 - 35 (U/L)   Alkaline Phosphatase 59  39 - 117 (U/L)   Total Bilirubin 0.4  0.3 - 1.2 (mg/dL)   GFR calc non Af Amer >90  >90 (mL/min)   GFR calc Af Amer >90  >90 (mL/min)  LIPASE, BLOOD      Component Value Range   Lipase 153 (*) 11 - 59 (U/L)  URINE MICROSCOPIC-ADD ON      Component Value Range   Squamous Epithelial / LPF RARE  RARE    WBC, UA 11-20  <3 (WBC/hpf)   Bacteria, UA MANY (*) RARE    Urine-Other MUCOUS PRESENT    WET PREP, GENITAL      Component Value Range   Yeast Wet Prep HPF POC NONE SEEN  NONE SEEN    Trich, Wet Prep MODERATE (*) NONE SEEN    Clue Cells Wet Prep HPF POC FEW (*) NONE SEEN  WBC,  Wet Prep HPF POC NONE SEEN  NONE SEEN        Ct Abdomen Pelvis Wo Contrast  01/03/2012  *RADIOLOGY REPORT*  Clinical Data: Bilateral abdominal pain and flank pain.  CT ABDOMEN AND PELVIS WITHOUT CONTRAST  Technique:  Multidetector CT imaging of the abdomen and pelvis was performed following the standard protocol without intravenous contrast.  Comparison: CT chest 11/10/2009  Findings: Dependent atelectasis in the lung bases.  The kidneys appear symmetrical in size and shape.  No pyelocaliectasis or ureterectasis.  No renal, ureteral, or bladder stones are visualized.  1.3 cm diameter low attenuation lesion in the mid pole of the left kidney likely represents a cyst.  The unenhanced appearance of the liver, spleen, gallbladder, pancreas, and retroperitoneal lymph nodes is unremarkable.  Stable appearance of fat containing left adrenal gland nodules since previous chest CT.  This is consistent with an adenoma. Calcification of the aorta without aneurysm. There is no free fluid or free air in the abdomen.  The stomach and small bowel are decompressed.  Stool filled colon without distension or wall thickening.  Pelvis:  There is a small amount of free fluid in the pelvis which might be physiologic.  The uterus and adnexal structures do not appear enlarged.  No inflammatory changes involving the sigmoid colon.  The appendix is normal.  Mild degenerative changes in the lumbar spine.  IMPRESSION: No renal or ureteral stone or obstruction.  Original Report Authenticated By: Marlon Pel, M.D.     1. Trichomonas   2. Pyelonephritis   3. Sexually transmissible disease       MDM  Patient seen and evaluated. Patient in no acute distress.  Pt discussed with Attending physician.  Lipase with mild elevation and do not suspect pancreatitis.  CT without signs for pancreatitis, distended biliary system or gallstones. No clinical signs for PID.  Pt with + trichomonas.  Treatment provided.  UA with sings for  UTI and pt with clinical symptoms consistent with possible pyelonephritis.  Will plan for appropriate tx.       Angus Seller, [place] 01/03/12 2204

## 2012-01-03 NOTE — ED Notes (Signed)
Pt c/o low back pain x 2 weeks, radiating to flanks starting last night.

## 2012-01-03 NOTE — ED Notes (Signed)
Pt states she has not had a job x 10-12 years, went back to work 2 weeks ago.

## 2012-01-03 NOTE — ED Notes (Signed)
Pt c/o low back pain x 2 weeks, radiating around to rib cage

## 2012-01-03 NOTE — Discharge Instructions (Signed)
Your lab tests today showed signs for a possible kidney infection and urinary tract infection. Your urine also showed signs for Trichomonas infection which is a sexually transmitted disease. You should inform all your sexual partners of the diagnosis before having intercourse and go to the [facility] health Department for further screening such as HIV and syphilis testing. Please take antibiotics were prescribed for the full length of time and followup with a primary care provider. If you develop any worsening symptoms, increased pain, persistent nausea vomiting please return to the emergency room.   Pyelonephritis, Adult Pyelonephritis is a kidney infection. A kidney infection can happen quickly, or it can last for a long time. HOME CARE   Take your medicine (antibiotics) as told. Finish it even if you start to feel better.   Keep all doctor visits as told.   Drink enough fluids to keep your pee (urine) clear or pale yellow.   Only take medicine as told by your doctor.  GET HELP RIGHT AWAY IF:   You have a fever.   You cannot take your medicine or drink fluids as told.   You have chills and shaking.   You feel very weak or pass out (faint).   You do not feel better after 2 days.  MAKE SURE YOU:  Understand these instructions.   Will watch your condition.   Will get help right away if you are not doing well or get worse.  Document Released: 10/29/2004 Document Revised: 09/10/2011 Document Reviewed: 03/11/2011 [facility] Patient Information 2012 [place], [place].   Trichomoniasis Trichomoniasis is an infection, caused by the Trichomonas organism, that affects both women and men. In women, the outer female genitalia and the vagina are affected. In men, the penis is mainly affected, but the prostate and other reproductive organs can also be involved. Trichomoniasis is a sexually transmitted disease (STD) and is most often passed to another person through sexual contact. The  majority of people who get trichomoniasis do so from a sexual encounter and are also at risk for other STDs. CAUSES   Sexual intercourse with an infected partner.   It can be present in swimming pools or hot tubs.  SYMPTOMS   Abnormal gray-green frothy vaginal discharge in women.   Vaginal itching and irritation in women.   Itching and irritation of the area outside the vagina in women.   Penile discharge with or without pain in males.   Inflammation of the urethra (urethritis), causing painful urination.   Bleeding after sexual intercourse.  RELATED COMPLICATIONS  Pelvic inflammatory disease.   Infection of the uterus (endometritis).   Infertility.   Tubal (ectopic) pregnancy.   It can be associated with other STDs, including gonorrhea and chlamydia, hepatitis B, and HIV.  COMPLICATIONS DURING PREGNANCY  Early (premature) delivery.   Premature rupture of the membranes (PROM).   Low birth weight.  DIAGNOSIS   Visualization of Trichomonas under the microscope from the vagina discharge.   Ph of the vagina greater than 4.5, tested with a test tape.   Trich Rapid Test.   Culture of the organism, but this is not usually needed.   It may be found on a Pap test.   Having a "strawberry cervix,"which means the cervix looks very red like a strawberry.  TREATMENT   You may be given medication to fight the infection. Inform your caregiver if you could be or are pregnant. Some medications used to treat the infection should not be taken during pregnancy.   Over-the-counter  medications or creams to decrease itching or irritation may be recommended.   Your sexual partner will need to be treated if infected.  HOME CARE INSTRUCTIONS   Take all medication prescribed by your caregiver.   Take over-the-counter medication for itching or irritation as directed by your caregiver.   Do not have sexual intercourse while you have the infection.   Do not douche or wear tampons.     Discuss your infection with your partner, as your partner may have acquired the infection from you. Or, your partner may have been the person who transmitted the infection to you.   Have your sex partner examined and treated if necessary.   Practice safe, informed, and protected sex.   See your caregiver for other STD testing.  SEEK MEDICAL CARE IF:   You still have symptoms after you finish the medication.   You have an oral temperature above 102 F (38.9 C).   You develop belly (abdominal) pain.   You have pain when you urinate.   You have bleeding after sexual intercourse.   You develop a rash.   The medication makes you sick or makes you throw up (vomit).  Document Released: 03/17/2001 Document Revised: 09/10/2011 Document Reviewed: 04/12/2009 [facility] Patient Information 2012 [place], [place].   RESOURCE GUIDE  Dental Problems  Patients with Medicaid: [facility] [phone] [address]                                           [phone] [address] Phone:  [phone]                                                  Phone:  [phone]  If unable to pay or uninsured, contact:  [facility] or [facility]. to become qualified for the adult dental clinic.  Chronic Pain Problems Contact [facility]  [phone] Patients need to be referred by their primary care doctor.  Insufficient Money for Medicine Contact [employer]:  call "211" or [facility] [phone].  No Primary Care Doctor Call [facility]  [phone] Other agencies that provide inexpensive medical care    [facility]  [phone]    [facility]  [phone]    [facility]  [phone]    [facility]  [phone]    [facility]  [phone]    [facility]  [phone]  [facility]  [phone] [facility]  [phone] [facility]  Mental Health   [phone] (emergency services [phone])  Substance Abuse Resources [facility]  [phone] [phone] [facility] [phone] [facility] [phone] Floyd[facility] Flock ([phone] Residential & Outpatient Substance Abuse Program  [phone]  Abuse/Neglect [facility] Child Abuse Hotline 671-881-4147 [facility] Child Abuse Hotline 407-650-0190 (After Hours)  Emergency Shelter [facility] 825-809-5814  Maternity Homes Room at the [place] [facility] [phone] [facility] [phone]  MRSA Hotline #:   7736551299    [facility] [place]  [place] Dept. [address]      [place] Phone:  [phone]                                   Phone:  [phone]                 Phone:  [place] Phone:  [place] [phone] (After Hours)

## 2012-01-04 LAB — GC/CHLAMYDIA PROBE AMP, GENITAL: Chlamydia, DNA Probe: NEGATIVE

## 2012-01-04 NOTE — ED Provider Notes (Signed)
Medical screening examination/treatment/procedure(s) were performed by non-physician practitioner and as supervising physician I was immediately available for consultation/collaboration.   Loren Racer, MD 01/04/12 [phone]

## 2012-01-05 ENCOUNTER — Encounter (HOSPITAL_COMMUNITY): Payer: Self-pay | Admitting: *Deleted

## 2012-01-05 ENCOUNTER — Inpatient Hospital Stay (HOSPITAL_COMMUNITY)
Admission: AD | Admit: 2012-01-05 | Discharge: 2012-01-05 | Disposition: A | Discharge status: Home / Self Care | Payer: Self-pay | Source: Ambulatory Visit | Attending: Family Medicine | Admitting: Family Medicine

## 2012-01-05 DIAGNOSIS — M5380 Other specified dorsopathies, site unspecified: Secondary | ICD-10-CM

## 2012-01-05 DIAGNOSIS — R109 Unspecified abdominal pain: Secondary | ICD-10-CM | POA: Insufficient documentation

## 2012-01-05 DIAGNOSIS — M545 Low back pain, unspecified: Secondary | ICD-10-CM | POA: Insufficient documentation

## 2012-01-05 DIAGNOSIS — N390 Urinary tract infection, site not specified: Secondary | ICD-10-CM | POA: Insufficient documentation

## 2012-01-05 DIAGNOSIS — M539 Dorsopathy, unspecified: Secondary | ICD-10-CM

## 2012-01-05 DIAGNOSIS — A5901 Trichomonal vulvovaginitis: Secondary | ICD-10-CM | POA: Insufficient documentation

## 2012-01-05 DIAGNOSIS — A599 Trichomoniasis, unspecified: Secondary | ICD-10-CM

## 2012-01-05 HISTORY — DX: Trichomoniasis, unspecified: A59.9

## 2012-01-05 HISTORY — DX: Headache: R51

## 2012-01-05 MED ORDER — HYDROXYZINE PAMOATE 50 MG PO CAPS: 50.0000 mg | ORAL_CAPSULE | Freq: Three times a day (TID) | ORAL | Status: AC | PRN

## 2012-01-05 NOTE — MAU Provider Note (Signed)
Chart reviewed and agree with management and plan.  

## 2012-01-05 NOTE — MAU Note (Signed)
Patient states she has had chronic back pain for a long time. Started in lower back and radiates to the right side then up under ribs on the right side. Got worse on 3-30. Went to the ED at [facility] and was diagnosed with pyelo and trich. Was given 3 Flagyl in the ED and a RX for Cipro which she has not gotten filled. Wanted a second opinion.

## 2012-01-05 NOTE — MAU Provider Note (Signed)
History   Pt presents today wanted a second opinion. She states she was recently seen at [facility] ED for low back pain, lower abd pain, and vag dc. She was diagnosed with trich, pyelonephritis. She was treated with Flagyl in the ED and was given an Rx for Cipro which she has not yet filled. She states she left the ED without understanding what is wrong with her and she just wants things to be explained. She denies any new or worsening problems since that time. She denies fever. She states she had a sudden onset of back pain on Saturday along with abd pain.  CSN: [id number]  Arrival date and time: 01/05/12 0941   None     Chief Complaint  Patient presents with  . Back Pain   HPI  OB History    Grav Para Term Preterm Abortions TAB SAB Ect Mult Living   3 3 3       3       No past medical history on file.  Past Surgical History  Procedure Date  . Cesarean section     No family history on file.  History  Substance Use Topics  . Smoking status: Current Everyday Smoker  . Smokeless tobacco: Not on file  . Alcohol Use: No    Allergies:  Allergies  Allergen Reactions  . Aspirin     Prescriptions prior to admission  Medication Sig Dispense Refill  . ciprofloxacin (CIPRO) 500 MG tablet Take 1 tablet (500 mg total) by mouth 2 (two) times daily. One po bid x 7 days  14 tablet  0    Review of Systems  Constitutional: Negative for fever and chills.  Eyes: Negative for blurred vision and double vision.  Respiratory: Negative for cough, hemoptysis, sputum production, shortness of breath and wheezing.   Cardiovascular: Negative for chest pain and palpitations.  Gastrointestinal: Positive for abdominal pain. Negative for nausea, vomiting, diarrhea and constipation.  Genitourinary: Negative for dysuria, urgency, frequency and hematuria.  Neurological: Negative for dizziness and headaches.  Psychiatric/Behavioral: Negative for depression and suicidal ideas.   Physical Exam   Blood  pressure 128/88, pulse 77, temperature 98.5 F (36.9 C), temperature source Oral, resp. rate 16, height 5\' 5"  (1.651 m), weight 135 lb 9.6 oz (61.508 kg), last menstrual period 12/11/2011, SpO2 100.00%.  Physical Exam  Nursing note and vitals reviewed. Constitutional: She is oriented to person, place, and time. She appears well-developed and well-nourished. No distress.  HENT:  Head: Normocephalic and atraumatic.  Eyes: EOM are normal. Pupils are equal, round, and reactive to light.  GI: Soft. She exhibits no distension and no mass. There is no tenderness. There is CVA tenderness (mild Rt CVA tenderness.). There is no rebound and no guarding.  Musculoskeletal:       Back:  Neurological: She is alert and oriented to person, place, and time.  Skin: Skin is warm and dry. She is not diaphoretic.  Psychiatric: She has a normal mood and affect. Her behavior is normal. Judgment and thought content normal.    MAU Course  Procedures  Results for orders placed during the hospital encounter of 01/03/12 (from the past 72 hour(s))  CBC     Status: Abnormal   Collection Time   01/03/12  3:50 AM      Component Value Range Comment   WBC 2.9 (*) 4.0 - 10.5 (K/uL)    RBC 3.44 (*) 3.87 - 5.11 (MIL/uL)    Hemoglobin 10.7 (*) 12.0 - 15.0 (g/dL)  HCT 30.8 (*) 36.0 - 46.0 (%)    MCV 89.5  78.0 - 100.0 (fL)    MCH 31.1  26.0 - 34.0 (pg)    MCHC 34.7  30.0 - 36.0 (g/dL)    RDW 16.1  09.6 - 04.5 (%)    Platelets 154  150 - 400 (K/uL)   DIFFERENTIAL     Status: Abnormal   Collection Time   01/03/12  3:50 AM      Component Value Range Comment   Neutrophils Relative 62  43 - 77 (%)    Neutro Abs 1.8  1.7 - 7.7 (K/uL)    Lymphocytes Relative 23  12 - 46 (%)    Lymphs Abs 0.7  0.7 - 4.0 (K/uL)    Monocytes Relative 15 (*) 3 - 12 (%)    Monocytes Absolute 0.4  0.1 - 1.0 (K/uL)    Eosinophils Relative 1  0 - 5 (%)    Eosinophils Absolute 0.0  0.0 - 0.7 (K/uL)    Basophils Relative 0  0 - 1 (%)     Basophils Absolute 0.0  0.0 - 0.1 (K/uL)   COMPREHENSIVE METABOLIC PANEL     Status: Abnormal   Collection Time   01/03/12  3:50 AM      Component Value Range Comment   Sodium 138  135 - 145 (mEq/L)    Potassium 3.4 (*) 3.5 - 5.1 (mEq/L)    Chloride 105  96 - 112 (mEq/L)    CO2 25  19 - 32 (mEq/L)    Glucose, Bld 102 (*) 70 - 99 (mg/dL)    BUN 12  6 - 23 (mg/dL)    Creatinine, Ser 4.09  0.50 - 1.10 (mg/dL)    Calcium 8.6  8.4 - 10.5 (mg/dL)    Total Protein 6.4  6.0 - 8.3 (g/dL)    Albumin 3.6  3.5 - 5.2 (g/dL)    AST 14  0 - 37 (U/L)    ALT 7  0 - 35 (U/L)    Alkaline Phosphatase 59  39 - 117 (U/L)    Total Bilirubin 0.4  0.3 - 1.2 (mg/dL)    GFR calc non Af Amer >90  >90 (mL/min)    GFR calc Af Amer >90  >90 (mL/min)   LIPASE, BLOOD     Status: Abnormal   Collection Time   01/03/12  3:50 AM      Component Value Range Comment   Lipase 153 (*) 11 - 59 (U/L)   URINALYSIS, ROUTINE W REFLEX MICROSCOPIC     Status: Abnormal   Collection Time   01/03/12  4:20 AM      Component Value Range Comment   Color, Urine YELLOW  YELLOW     APPearance CLOUDY (*) CLEAR     Specific Gravity, Urine 1.018  1.005 - 1.030     pH 7.0  5.0 - 8.0     Glucose, UA NEGATIVE  NEGATIVE (mg/dL)    Hgb urine dipstick NEGATIVE  NEGATIVE     Bilirubin Urine NEGATIVE  NEGATIVE     Ketones, ur NEGATIVE  NEGATIVE (mg/dL)    Protein, ur NEGATIVE  NEGATIVE (mg/dL)    Urobilinogen, UA 2.0 (*) 0.0 - 1.0 (mg/dL)    Nitrite POSITIVE (*) NEGATIVE     Leukocytes, UA SMALL (*) NEGATIVE    URINE MICROSCOPIC-ADD ON     Status: Abnormal   Collection Time   01/03/12  4:20 AM  Component Value Range Comment   Squamous Epithelial / LPF RARE  RARE     WBC, UA 11-20  <3 (WBC/hpf)    Bacteria, UA MANY (*) RARE     Urine-Other MUCOUS PRESENT   TRICHOMONAS PRESENT  GC/CHLAMYDIA PROBE AMP, GENITAL     Status: Normal   Collection Time   01/03/12  6:06 AM      Component Value Range Comment   GC Probe Amp, Genital NEGATIVE   NEGATIVE     Chlamydia, DNA Probe NEGATIVE  NEGATIVE    WET PREP, GENITAL     Status: Abnormal   Collection Time   01/03/12  6:06 AM      Component Value Range Comment   Yeast Wet Prep HPF POC NONE SEEN  NONE SEEN     Trich, Wet Prep MODERATE (*) NONE SEEN     Clue Cells Wet Prep HPF POC FEW (*) NONE SEEN     WBC, Wet Prep HPF POC NONE SEEN  NONE SEEN       Assessment and Plan  Trichomoniasis: discussed with pt at length. Pt has already been treated. She will f/u with her PCP.  UTI: she needs to get her antibiotic filled. We will send her urine for culture and she will f/u with her PCP.  Back pain: I also think she has some muscle strain. Will give her an Rx for vistaril to use as needed. Discussed diet, activity, risks, and precautions.  Clinton Gallant. Cleon Signorelli III, DrHSc, MPAS, PA-C  01/05/2012, 10:21 AM

## 2012-01-05 NOTE — Discharge Instructions (Signed)
Trichomoniasis Trichomoniasis is an infection, caused by the Trichomonas organism, that affects both women and men. In women, the outer female genitalia and the vagina are affected. In men, the penis is mainly affected, but the prostate and other reproductive organs can also be involved. Trichomoniasis is a sexually transmitted disease (STD) and is most often passed to another person through sexual contact. The majority of people who get trichomoniasis do so from a sexual encounter and are also at risk for other STDs. CAUSES   Sexual intercourse with an infected partner.   It can be present in swimming pools or hot tubs.  SYMPTOMS   Abnormal gray-green frothy vaginal discharge in women.   Vaginal itching and irritation in women.   Itching and irritation of the area outside the vagina in women.   Penile discharge with or without pain in males.   Inflammation of the urethra (urethritis), causing painful urination.   Bleeding after sexual intercourse.  RELATED COMPLICATIONS  Pelvic inflammatory disease.   Infection of the uterus (endometritis).   Infertility.   Tubal (ectopic) pregnancy.   It can be associated with other STDs, including gonorrhea and chlamydia, hepatitis B, and HIV.  COMPLICATIONS DURING PREGNANCY  Early (premature) delivery.   Premature rupture of the membranes (PROM).   Low birth weight.  DIAGNOSIS   Visualization of Trichomonas under the microscope from the vagina discharge.   Ph of the vagina greater than 4.5, tested with a test tape.   Trich Rapid Test.   Culture of the organism, but this is not usually needed.   It may be found on a Pap test.   Having a "strawberry cervix,"which means the cervix looks very red like a strawberry.  TREATMENT   You may be given medication to fight the infection. Inform your caregiver if you could be or are pregnant. Some medications used to treat the infection should not be taken during pregnancy.    Over-the-counter medications or creams to decrease itching or irritation may be recommended.   Your sexual partner will need to be treated if infected.  HOME CARE INSTRUCTIONS   Take all medication prescribed by your caregiver.   Take over-the-counter medication for itching or irritation as directed by your caregiver.   Do not have sexual intercourse while you have the infection.   Do not douche or wear tampons.   Discuss your infection with your partner, as your partner may have acquired the infection from you. Or, your partner may have been the person who transmitted the infection to you.   Have your sex partner examined and treated if necessary.   Practice safe, informed, and protected sex.   See your caregiver for other STD testing.  SEEK MEDICAL CARE IF:   You still have symptoms after you finish the medication.   You have an oral temperature above 102 F (38.9 C).   You develop belly (abdominal) pain.   You have pain when you urinate.   You have bleeding after sexual intercourse.   You develop a rash.   The medication makes you sick or makes you throw up (vomit).  Document Released: 03/17/2001 Document Revised: 09/10/2011 Document Reviewed: 04/12/2009 Exit[employer] Patient Information 2012 [employer].Urinary Tract Infection Infections of the urinary tract can start in several places. A bladder infection (cystitis), a kidney infection (pyelonephritis), and a prostate infection (prostatitis) are different types of urinary tract infections (UTIs). They usually get better if treated with medicines (antibiotics) that kill germs. Take all the medicine until it   is gone. You or your child may feel better in a few days, but TAKE ALL MEDICINE or the infection may not respond and may become more difficult to treat. HOME CARE INSTRUCTIONS   Drink enough water and fluids to keep the urine clear or pale yellow. Cranberry juice is especially recommended, in addition to large  amounts of water.   Avoid caffeine, tea, and carbonated beverages. They tend to irritate the bladder.   Alcohol may irritate the prostate.   Only take over-the-counter or prescription medicines for pain, discomfort, or fever as directed by your caregiver.  To prevent further infections:  Empty the bladder often. Avoid holding urine for long periods of time.   After a bowel movement, women should cleanse from front to back. Use each tissue only once.   Empty the bladder before and after sexual intercourse.  FINDING OUT THE RESULTS OF YOUR TEST Not all test results are available during your visit. If your or your child's test results are not back during the visit, make an appointment with your caregiver to find out the results. Do not assume everything is normal if you have not heard from your caregiver or the medical facility. It is important for you to follow up on all test results. SEEK MEDICAL CARE IF:   There is back pain.   Your baby is older than 3 months with a rectal temperature of 100.5 F (38.1 C) or higher for more than 1 day.   Your or your child's problems (symptoms) are no better in 3 days. Return sooner if you or your child is getting worse.  SEEK IMMEDIATE MEDICAL CARE IF:   There is severe back pain or lower abdominal pain.   You or your child develops chills.   You have a fever.   Your baby is older than 3 months with a rectal temperature of 102 F (38.9 C) or higher.   Your baby is 3 months old or younger with a rectal temperature of 100.4 F (38 C) or higher.   There is nausea or vomiting.   There is continued burning or discomfort with urination.  MAKE SURE YOU:   Understand these instructions.   Will watch your condition.   Will get help right away if you are not doing well or get worse.  Document Released: 07/01/2005 Document Revised: 09/10/2011 Document Reviewed: 02/03/2007 Exit[employer] Patient Information 2012 [employer]. 

## 2012-01-20 NOTE — ED Provider Notes (Signed)
Medical screening examination/treatment/procedure(s) were performed by non-physician practitioner and as supervising physician I was immediately available for consultation/collaboration.   Jailin Manocchio, MD 01/20/12 2111 

## 2013-02-05 ENCOUNTER — Emergency Department (HOSPITAL_COMMUNITY)
Admission: EM | Admit: 2013-02-05 | Discharge: 2013-02-05 | Disposition: A | Discharge status: Home / Self Care | Payer: Self-pay | Attending: Emergency Medicine | Admitting: Emergency Medicine

## 2013-02-05 ENCOUNTER — Encounter (HOSPITAL_COMMUNITY): Payer: Self-pay | Admitting: *Deleted

## 2013-02-05 DIAGNOSIS — G8929 Other chronic pain: Secondary | ICD-10-CM | POA: Insufficient documentation

## 2013-02-05 DIAGNOSIS — R6883 Chills (without fever): Secondary | ICD-10-CM | POA: Insufficient documentation

## 2013-02-05 DIAGNOSIS — F17200 Nicotine dependence, unspecified, uncomplicated: Secondary | ICD-10-CM | POA: Insufficient documentation

## 2013-02-05 DIAGNOSIS — R55 Syncope and collapse: Secondary | ICD-10-CM

## 2013-02-05 DIAGNOSIS — K089 Disorder of teeth and supporting structures, unspecified: Secondary | ICD-10-CM | POA: Insufficient documentation

## 2013-02-05 DIAGNOSIS — Z8619 Personal history of other infectious and parasitic diseases: Secondary | ICD-10-CM | POA: Insufficient documentation

## 2013-02-05 DIAGNOSIS — Z8669 Personal history of other diseases of the nervous system and sense organs: Secondary | ICD-10-CM | POA: Insufficient documentation

## 2013-02-05 DIAGNOSIS — K029 Dental caries, unspecified: Secondary | ICD-10-CM | POA: Insufficient documentation

## 2013-02-05 DIAGNOSIS — K0889 Other specified disorders of teeth and supporting structures: Secondary | ICD-10-CM

## 2013-02-05 DIAGNOSIS — M25559 Pain in unspecified hip: Secondary | ICD-10-CM | POA: Insufficient documentation

## 2013-02-05 DIAGNOSIS — K044 Acute apical periodontitis of pulpal origin: Secondary | ICD-10-CM | POA: Insufficient documentation

## 2013-02-05 DIAGNOSIS — K047 Periapical abscess without sinus: Secondary | ICD-10-CM

## 2013-02-05 LAB — GLUCOSE, CAPILLARY

## 2013-02-05 LAB — BASIC METABOLIC PANEL
Calcium: 8.8 mg/dL (ref 8.4–10.5)
Creatinine, Ser: 0.74 mg/dL (ref 0.50–1.10)
GFR calc Af Amer: >90 mL/min (ref >90)
GFR calc non Af Amer: >90 mL/min (ref >90)

## 2013-02-05 LAB — CBC
Platelets: 139 10*3/uL — ABNORMAL LOW (ref 150–400)
RDW: 12.1 % (ref 11.5–15.5)
WBC: 3.2 10*3/uL — ABNORMAL LOW (ref 4.0–10.5)

## 2013-02-05 MED ORDER — OXYCODONE-ACETAMINOPHEN 5-325 MG PO TABS: ORAL_TABLET | ORAL | Status: DC

## 2013-02-05 MED ORDER — AMOXICILLIN 500 MG PO CAPS: 500.0000 mg | ORAL_CAPSULE | Freq: Three times a day (TID) | ORAL | Status: DC

## 2013-02-05 MED ORDER — SODIUM CHLORIDE 0.9 % IV BOLUS (SEPSIS)
1000.0000 mL | Freq: Once | INTRAVENOUS | Status: AC
  Administered 2013-02-05: 1000 mL via INTRAVENOUS

## 2013-02-05 NOTE — ED Notes (Addendum)
Pt alert and oriented x4. Respirations even and unlabored, bilateral symmetrical rise and fall of chest. Skin warm and dry. In no acute distress. Denies needs.   

## 2013-02-05 NOTE — ED Notes (Addendum)
Per ems pt has abscessed tooth x2-3 days. Pt walked outside. Pt felt weak and family helped her to the floor. Pt unsure if she had a syncopal episode. Dental pain 5/10.  Chronic non traumatic left hip pain 5/10.   Family reports pt is on menstrual cycle.

## 2013-02-05 NOTE — ED Notes (Signed)
HYQ:[id number]<IO> Expected date:02/05/13<BR> Expected time:<BR> Means of arrival:<BR> Comments:<BR> EMS/fall/head injury/pain

## 2013-02-05 NOTE — ED Provider Notes (Signed)
History     CSN: [id number]  Arrival date & time 02/05/13  1737   First MD Initiated Contact with Patient 02/05/13 1757      Chief Complaint  Patient presents with  . Near Syncope    (Consider location/radiation/quality/duration/timing/severity/associated sxs/prior treatment) HPI Pt is a 47yo female BIB ems after near syncope episode earlier today.  Pt has abscessed tooth x2-3days.  Pt reports increased, sharp throbbing dental pain not relieved by hydrocodone last night.  Has not eating for a few days due to pain and admits to not pushing fluids.  States this morning pain was severe and face was swollen.  She believes an abscess popped and relieved pressure.  Pt drank 2 shots of vodka earlier today for the pain, became very hot so pt's family took her outside at which point she began to pass out so family helped her to the ground.  Did not hit head or injure herself during this episode.  Pt reports pain is now 3/10 in ED.  Pt also c/o chronic left hip pain 5/10 but does not go to pain management. Denies fever, n/v/d.  Past Medical History  Diagnosis Date  . Headache   . Trichimoniasis     Past Surgical History  Procedure Laterality Date  . Cesarean section    . Tubal ligation    . Wisdom tooth extraction      History reviewed. No pertinent family history.  History  Substance Use Topics  . Smoking status: Current Every Day Smoker  . Smokeless tobacco: Not on file  . Alcohol Use: No    OB History   Grav Para Term Preterm Abortions TAB SAB Ect Mult Living   3 3 3       3       Review of Systems  Constitutional: Positive for chills. Negative for fever.  HENT: Positive for dental problem. Negative for facial swelling, drooling, trouble swallowing and voice change.   Gastrointestinal: Negative for nausea, vomiting and diarrhea.  All other systems reviewed and are negative.    Allergies  Aspirin  Home Medications   Current Outpatient Rx  Name  Route  Sig  Dispense   Refill  . amoxicillin (AMOXIL) 500 MG capsule   Oral   Take 1 capsule (500 mg total) by mouth 3 (three) times daily.   30 capsule   0   . oxyCODONE-acetaminophen (PERCOCET/ROXICET) 5-325 MG per tablet      Take 1-2 pills every 6 hours as needed for pain   6 tablet   0     BP 108/70  Pulse 84  Temp(Src) 98.6 F (37 C) (Oral)  Resp 18  SpO2 99%  Physical Exam  Nursing note and vitals reviewed. Constitutional: She is oriented to person, place, and time. She appears well-developed and well-nourished. No distress.  Pt sitting up in exam bed, NAD  HENT:  Head: Normocephalic and atraumatic. No trismus in the jaw.  Mouth/Throat: Uvula is midline, oropharynx is clear and moist and mucous membranes are normal. Abnormal dentition. Dental abscesses and dental caries present. No edematous.    Eyes: Conjunctivae and EOM are normal. Pupils are equal, round, and reactive to light. No scleral icterus.  Neck: Normal range of motion. Neck supple.  Cardiovascular: Normal rate, regular rhythm and normal heart sounds.   Pulmonary/Chest: Effort normal and breath sounds normal. No respiratory distress. She has no wheezes. She has no rales. She exhibits no tenderness.  Pt speaking in full paragraphs   Abdominal:  Soft. Bowel sounds are normal. She exhibits no distension. There is no tenderness.  Musculoskeletal: Normal range of motion. She exhibits tenderness ( left lower back and hip). She exhibits no edema.  Neurological: She is alert and oriented to person, place, and time. No cranial nerve deficit. Coordination normal.  CN II-XII in tact, nl strength and sensation. Coordination in tact.  Neg romberg. Nl gait  Skin: Skin is warm and dry. She is not diaphoretic.  Psychiatric: She has a normal mood and affect. Her behavior is normal.    ED Course  Procedures (including critical care time)  Labs Reviewed  BASIC METABOLIC PANEL - Abnormal; Notable for the following:    Sodium 133 (*)    All  other components within normal limits  CBC - Abnormal; Notable for the following:    WBC 3.2 (*)    RBC 3.68 (*)    Hemoglobin 11.7 (*)    HCT 32.5 (*)    Platelets 139 (*)    All other components within normal limits  GLUCOSE, CAPILLARY   No results found.   1. Near syncope   2. Dental infection   3. Pain, dental       MDM  Pt w/ dental abscess x2-3 BIB EMS for near syncopal episode.  Admits to drinking 2 vodka shots for pain prior to feeling hot and dizzy.  Pt taking hydrocodone for tooth pain.  Pt A&Ox4, NAD, no respiratory distress.  Reports pain 3/10 in ED.  Pressure is low with systolic in 90s but pt states she normally runs in low 100s.  Will give pt fluids. PE-dental abscess present in front left upper incisor.  No peritonsillar abscess noted. No neck pain or nuchal rigidity.  CBC BMP and Glucose noncontributory.  After fluids, will ambulate pt and discharge home.  Rx: amoxacillin and percocet for dental abscess.  Have pt call within 24-48hrs Dr. Oswaldo Done DDS for f/u appointment.    Discussed with Dr. Patria Mane who agreed with plan.   Vitals: unremarkable. Discharged in stable condition.    Discussed pt with attending during ED encounter.          Junius Finner, PA-C 02/05/13 2034

## 2013-02-06 NOTE — ED Provider Notes (Signed)
Medical screening examination/treatment/procedure(s) were performed by non-physician practitioner and as supervising physician I was immediately available for consultation/collaboration.  Lyanne Co, MD 02/06/13 [id number]

## 2014-08-06 ENCOUNTER — Encounter (HOSPITAL_COMMUNITY): Payer: Self-pay | Admitting: *Deleted

## 2018-01-03 ENCOUNTER — Emergency Department
Admission: EM | Admit: 2018-01-03 | Discharge: 2018-01-03 | Disposition: A | Discharge status: Home / Self Care | Payer: Self-pay | Attending: Emergency Medicine | Admitting: Emergency Medicine

## 2018-01-03 ENCOUNTER — Other Ambulatory Visit: Payer: Self-pay

## 2018-01-03 ENCOUNTER — Encounter: Payer: Self-pay | Admitting: Emergency Medicine

## 2018-01-03 DIAGNOSIS — B9689 Other specified bacterial agents as the cause of diseases classified elsewhere: Secondary | ICD-10-CM | POA: Insufficient documentation

## 2018-01-03 DIAGNOSIS — N760 Acute vaginitis: Secondary | ICD-10-CM

## 2018-01-03 DIAGNOSIS — R102 Pelvic and perineal pain: Secondary | ICD-10-CM

## 2018-01-03 DIAGNOSIS — A599 Trichomoniasis, unspecified: Secondary | ICD-10-CM

## 2018-01-03 DIAGNOSIS — F17200 Nicotine dependence, unspecified, uncomplicated: Secondary | ICD-10-CM | POA: Insufficient documentation

## 2018-01-03 LAB — WET PREP, GENITAL
SPERM: NONE SEEN
Yeast Wet Prep HPF POC: NONE SEEN

## 2018-01-03 LAB — CHLAMYDIA/NGC RT PCR (ARMC ONLY)
Chlamydia Tr: NOT DETECTED
N gonorrhoeae: NOT DETECTED

## 2018-01-03 MED ORDER — METRONIDAZOLE 500 MG PO TABS
2000.0000 mg | ORAL_TABLET | Freq: Once | ORAL | Status: AC
  Administered 2018-01-03: 2000 mg via ORAL
  Filled 2018-01-03: qty 4

## 2018-01-03 MED ORDER — CEFTRIAXONE SODIUM 250 MG IJ SOLR
250.0000 mg | Freq: Once | INTRAMUSCULAR | Status: AC
  Administered 2018-01-03: 250 mg via INTRAMUSCULAR
  Filled 2018-01-03: qty 250

## 2018-01-03 MED ORDER — ONDANSETRON 4 MG PO TBDP
4.0000 mg | ORAL_TABLET | Freq: Once | ORAL | Status: AC
  Administered 2018-01-03: 4 mg via ORAL
  Filled 2018-01-03: qty 1

## 2018-01-03 MED ORDER — AZITHROMYCIN 500 MG PO TABS
1000.0000 mg | ORAL_TABLET | Freq: Every day | ORAL | Status: DC
  Administered 2018-01-03: 1000 mg via ORAL
  Filled 2018-01-03: qty 2

## 2018-01-03 MED ORDER — LIDOCAINE HCL (PF) 1 % IJ SOLN
5.0000 mL | Freq: Once | INTRAMUSCULAR | Status: AC
  Administered 2018-01-03: 5 mL via INTRADERMAL
  Filled 2018-01-03: qty 5

## 2018-01-03 NOTE — ED Provider Notes (Signed)
[facility] Emergency Department Provider Note  ____________________________________________  Time seen: Approximately 12:59 PM  I have reviewed the triage vital signs and the nursing notes.   HISTORY  Chief Complaint No chief complaint on file.  HPI Heather Donaldson is a 52 y.o. female who presents to the emergency department for treatment and evaluation of vaginal pain and pelvic pain that has been getting worse for "3 years." She states that she also has a vaginal discharge and it hurts to stand or walk. She reports having a monogamous relationship, but thinks that she may have had whatever this is prior to her current relationship. She denies fever.  Past Medical History:  Diagnosis Date  . Headache(784.0)   . Trichimoniasis     Patient Active Problem List   Diagnosis Date Noted  . MUSCLE SPASM, BACK 03/18/2010  . HERPES GENITALIS 02/18/2010  . TRICHOMONIASIS 02/18/2010  . DENTAL CARIES 02/18/2010  . HEADACHE 02/18/2010  . PANCYTOPENIA 11/29/2009  . TOBACCO ABUSE 11/29/2009  . PNEUMONIA, LEFT LOWER LOBE 11/17/2009  . DEPRESSION 10/05/1998    Past Surgical History:  Procedure Laterality Date  . CESAREAN SECTION    . TUBAL LIGATION    . WISDOM TOOTH EXTRACTION      Prior to Admission medications   Medication Sig Start Date End Date Taking? Authorizing Provider  amoxicillin (AMOXIL) 500 MG capsule Take 1 capsule (500 mg total) by mouth 3 (three) times daily. 02/05/13   Lurene ShadowPhelps, Erin O, PA-C  oxyCODONE-acetaminophen (PERCOCET/ROXICET) 5-325 MG per tablet Take 1-2 pills every 6 hours as needed for pain 02/05/13   Lurene ShadowPhelps, Erin O, PA-C    Allergies Aspirin  No family history on file.  Social History Social History   Tobacco Use  . Smoking status: Current Every Day Smoker  . Smokeless tobacco: Never Used  Substance Use Topics  . Alcohol use: No  . Drug use: No    Review of Systems Constitutional: Negataive for fever. Respiratory: Negative  for shortness of breath or cough. Gastrointestinal: Positive for abdominal pain; negative for nausea , negative for vomiting. Genitourinary: Negative for dysuria , positive for vaginal discharge. Musculoskeletal: Negative for back pain. Skin: Negative for rash, lesion, or wound. ____________________________________________   PHYSICAL EXAM:  VITAL SIGNS: ED Triage Vitals [01/03/18 1209]  Enc Vitals Group     BP 118/76     Pulse Rate 78     Resp 18     Temp 98.5 F (36.9 C)     Temp Source Oral     SpO2 98 %     Weight      Height      Head Circumference      Peak Flow      Pain Score 9     Pain Loc      Pain Edu?      Excl. in GC?     Constitutional: Alert and oriented. Well appearing and in no acute distress. Eyes: Conjunctivae are normal. PERRL. Head: Atraumatic. Nose: No congestion/rhinnorhea. Mouth/Throat: Mucous membranes are moist. Respiratory: Normal respiratory effort.  No retractions. Gastrointestinal: Suprapubic tenderness on exam, otherwise normal exam Genitourinary: Pelvic exam: Unusual anatomy surrounding the urethral meatus that initially has the appearance of a uterine prolapse.  Malodorous discharge in the vaginal vault that is mucopurulent. Cervix is friable, closed, and has petechial spots. Musculoskeletal: No extremity tenderness nor edema.  Neurologic:  Normal speech and language. No gross focal neurologic deficits are appreciated. Speech is normal. No gait instability. Skin:  Skin is warm, dry and intact. No rash noted. Psychiatric: Mood and affect are normal. Speech and behavior are normal.  ____________________________________________   LABS (all labs ordered are listed, but only abnormal results are displayed)  Labs Reviewed  WET PREP, GENITAL - Abnormal; Notable for the following components:      Result Value   Trich, Wet Prep PRESENT (*)    Clue Cells Wet Prep HPF POC PRESENT (*)    WBC, Wet Prep HPF POC MANY (*)    All other components  within normal limits  CHLAMYDIA/NGC RT PCR ([facility] ONLY)   ____________________________________________  RADIOLOGY  Not indicated. ____________________________________________   PROCEDURES  Procedure(s) performed: None  ____________________________________________  52 year old female who presents to the emergency department for treatment and evaluation of pelvic pain that has acutely worsened over the past few days. Symptoms and exam are most consistent with trichomonas and differential diagnosis would include gonorrhea, chlamydia, and PID.  She will be treated for the known bacterial vaginosis and trichomonas with 2 g of Flagyl.  She will be empirically treated with ceftriaxone and azithromycin due to increasing pelvic pain and pain with ambulation.  Patient was strongly advised to avoid having intercourse until her partner has been tested and treated.  She was given Facilities manager for free STD testing at the health department.  She was encouraged to schedule an appointment in about a month to make sure that the infections have cleared.  She was advised to return to the emergency department for symptoms of change or worsen if she is unable to schedule an appointment.  INITIAL IMPRESSION / ASSESSMENT AND PLAN / ED COURSE  Pertinent labs & imaging results that were available during my care of the patient were reviewed by me and considered in my medical decision making (see chart for details).  ____________________________________________   FINAL CLINICAL IMPRESSION(S) / ED DIAGNOSES  Final diagnoses:  Trichimoniasis  Bacterial vaginosis  Pelvic pain    Note:  This document was prepared using Dragon voice recognition software and may include unintentional dictation errors.    Chinita Pester, FNP 01/03/18 1540    Jene Every, MD 01/04/18 [phone]

## 2018-01-03 NOTE — ED Triage Notes (Signed)
First Nurse Note:  C/O vaginal discomfort, worse with inter coarse, for "a while"

## 2018-11-06 ENCOUNTER — Emergency Department
Admission: EM | Admit: 2018-11-06 | Discharge: 2018-11-06 | Disposition: A | Discharge status: Home / Self Care | Payer: PRIVATE HEALTH INSURANCE | Attending: Student in an Organized Health Care Education/Training Program | Admitting: Student in an Organized Health Care Education/Training Program

## 2018-11-06 ENCOUNTER — Encounter: Payer: Self-pay | Admitting: Emergency Medicine

## 2018-11-06 DIAGNOSIS — H9203 Otalgia, bilateral: Secondary | ICD-10-CM | POA: Diagnosis not present

## 2018-11-06 DIAGNOSIS — F17200 Nicotine dependence, unspecified, uncomplicated: Secondary | ICD-10-CM | POA: Insufficient documentation

## 2018-11-06 MED ORDER — AZITHROMYCIN 250 MG PO TABS: ORAL_TABLET | ORAL | 0 refills | Status: AC

## 2018-11-06 NOTE — ED Triage Notes (Signed)
Patient with complaint of bilateral ear pain and headache that started 2 days ago.

## 2018-11-06 NOTE — ED Provider Notes (Signed)
[facility] Emergency Department Provider Note    First MD Initiated Contact with Patient 11/06/18 0700     (approximate)  I have reviewed the triage vital signs and the nursing notes.   HISTORY  Chief Complaint Otalgia and Headache    HPI Heather Donaldson is a 53 y.o. female w below listed pmh p/w c/c days of bilateral ear pain and decreased hearing.  No fevers.  Has had nonproductive cough.  No sore throat.  Denies any other associated symptoms.  Was concerned that her ears were impacted with wax.  Past Medical History:  Diagnosis Date  . Headache(784.0)   . Trichimoniasis    No family history on file. Past Surgical History:  Procedure Laterality Date  . CESAREAN SECTION    . TUBAL LIGATION    . WISDOM TOOTH EXTRACTION     Patient Active Problem List   Diagnosis Date Noted  . MUSCLE SPASM, BACK 03/18/2010  . HERPES GENITALIS 02/18/2010  . TRICHOMONIASIS 02/18/2010  . DENTAL CARIES 02/18/2010  . HEADACHE 02/18/2010  . PANCYTOPENIA 11/29/2009  . TOBACCO ABUSE 11/29/2009  . PNEUMONIA, LEFT LOWER LOBE 11/17/2009  . DEPRESSION 10/05/1998      Prior to Admission medications   Medication Sig Start Date End Date Taking? Authorizing Provider  amoxicillin (AMOXIL) 500 MG capsule Take 1 capsule (500 mg total) by mouth 3 (three) times daily. 02/05/13   Lurene Shadow, PA-C  azithromycin (ZITHROMAX Z-PAK) 250 MG tablet Take 2 tablets (500 mg) on  Day 1,  followed by 1 tablet (250 mg) once daily on Days 2 through 5. 11/06/18 11/11/18  Willy Eddy, MD  oxyCODONE-acetaminophen (PERCOCET/ROXICET) 5-325 MG per tablet Take 1-2 pills every 6 hours as needed for pain 02/05/13   Lurene Shadow, PA-C    Allergies Aspirin    Social History Social History   Tobacco Use  . Smoking status: Current Every Day Smoker  . Smokeless tobacco: Never Used  Substance Use Topics  . Alcohol use: No  . Drug use: No    Review of Systems Patient denies headaches,  rhinorrhea, blurry vision, numbness, shortness of breath, chest pain, edema, cough, abdominal pain, nausea, vomiting, diarrhea, dysuria, fevers, rashes or hallucinations unless otherwise stated above in HPI. ____________________________________________   PHYSICAL EXAM:  VITAL SIGNS: Vitals:   11/06/18 0043 11/06/18 0655  BP: (!) 99/56 104/76  Pulse: 77 74  Resp: 18 18  Temp: 98.2 F (36.8 C)   SpO2: 99% 97%    Constitutional: Alert and oriented. Well appearing and in no acute distress. Eyes: Conjunctivae are normal.  Ears: Bilateral purulent effusion with dull TMs bilaterally.  No erythema.  No mastoid tenderness. Head: Atraumatic. Nose: No congestion/rhinnorhea. Mouth/Throat: Mucous membranes are moist.   Neck: Painless ROM.  Cardiovascular:   Good peripheral circulation. Respiratory: Normal respiratory effort.  No retractions.  Gastrointestinal: Soft and nontender.  Musculoskeletal: No lower extremity tenderness .  No joint effusions. Neurologic:  Normal speech and language. No gross focal neurologic deficits are appreciated.  Skin:  Skin is warm, dry and intact. No rash noted. Psychiatric: Mood and affect are normal. Speech and behavior are normal.  ____________________________________________   LABS (all labs ordered are listed, but only abnormal results are displayed)  No results found for this or any previous visit (from the past 24 hour(s)). ____________________________________________ ____________________________________________   PROCEDURES  Procedure(s) performed:  Procedures    Critical Care performed: no ____________________________________________   INITIAL IMPRESSION / ASSESSMENT AND PLAN /  ED COURSE  Pertinent labs & imaging results that were available during my care of the patient were reviewed by me and considered in my medical decision making (see chart for details).  DDX: aom, aoe, hearing loss  Heather Donaldson is a 53 y.o. who presents to  the ED with was as described above.  Patient in no acute distress.  Patient with evidence of probable otitis.  No evidence of cerumen impaction.  Will trial course of antibiotics and give referral to ear nose and throat.      ____________________________________________   FINAL CLINICAL IMPRESSION(S) / ED DIAGNOSES  Final diagnoses:  Otalgia of both ears      NEW MEDICATIONS STARTED DURING THIS VISIT:  New Prescriptions   AZITHROMYCIN (ZITHROMAX Z-PAK) 250 MG TABLET    Take 2 tablets (500 mg) on  Day 1,  followed by 1 tablet (250 mg) once daily on Days 2 through 5.     Note:  This document was prepared using Dragon voice recognition software and may include unintentional dictation errors.     Willy Eddy, MD 11/06/18 [phone]

## 2018-11-06 NOTE — ED Notes (Signed)
Dr Robinson at bedside 

## 2019-05-26 ENCOUNTER — Other Ambulatory Visit: Payer: Self-pay

## 2019-05-26 DIAGNOSIS — Z20822 Contact with and (suspected) exposure to covid-19: Secondary | ICD-10-CM

## 2019-05-27 LAB — NOVEL CORONAVIRUS, NAA: SARS-CoV-2, NAA: NOT DETECTED

## 2019-06-05 ENCOUNTER — Telehealth: Payer: Self-pay | Admitting: General Practice

## 2019-06-05 NOTE — Telephone Encounter (Signed)
Patient informed of negative test results.

## 2019-06-17 ENCOUNTER — Other Ambulatory Visit: Payer: Self-pay

## 2019-06-17 ENCOUNTER — Emergency Department (HOSPITAL_COMMUNITY)
Admission: EM | Admit: 2019-06-17 | Discharge: 2019-06-17 | Disposition: A | Discharge status: Home / Self Care | Payer: Self-pay | Attending: Emergency Medicine | Admitting: Emergency Medicine

## 2019-06-17 ENCOUNTER — Emergency Department (HOSPITAL_COMMUNITY): Payer: Self-pay

## 2019-06-17 DIAGNOSIS — Z79899 Other long term (current) drug therapy: Secondary | ICD-10-CM | POA: Insufficient documentation

## 2019-06-17 DIAGNOSIS — Y939 Activity, unspecified: Secondary | ICD-10-CM | POA: Insufficient documentation

## 2019-06-17 DIAGNOSIS — X58XXXA Exposure to other specified factors, initial encounter: Secondary | ICD-10-CM | POA: Insufficient documentation

## 2019-06-17 DIAGNOSIS — Y929 Unspecified place or not applicable: Secondary | ICD-10-CM | POA: Insufficient documentation

## 2019-06-17 DIAGNOSIS — S93311A Subluxation of tarsal joint of right foot, initial encounter: Secondary | ICD-10-CM | POA: Insufficient documentation

## 2019-06-17 DIAGNOSIS — F17210 Nicotine dependence, cigarettes, uncomplicated: Secondary | ICD-10-CM | POA: Insufficient documentation

## 2019-06-17 DIAGNOSIS — Y999 Unspecified external cause status: Secondary | ICD-10-CM | POA: Insufficient documentation

## 2019-06-17 NOTE — ED Provider Notes (Signed)
[facility] DEPT Provider Note   CSN: [id number] Arrival date & time: 06/17/19  0105     History   Chief Complaint Chief Complaint  Patient presents with  . Foot Pain    HPI Heather Donaldson is a 53 y.o. female.     The history is provided by the patient. No language interpreter was used.  Foot Pain This is a new problem. The current episode started yesterday. The problem occurs constantly. The problem has not changed since onset.The symptoms are aggravated by walking. Nothing relieves the symptoms. Treatments tried: NSAIDs. The treatment provided mild relief.    Past Medical History:  Diagnosis Date  . Headache(784.0)   . Trichimoniasis     Patient Active Problem List   Diagnosis Date Noted  . MUSCLE SPASM, BACK 03/18/2010  . HERPES GENITALIS 02/18/2010  . TRICHOMONIASIS 02/18/2010  . DENTAL CARIES 02/18/2010  . HEADACHE 02/18/2010  . PANCYTOPENIA 11/29/2009  . TOBACCO ABUSE 11/29/2009  . PNEUMONIA, LEFT LOWER LOBE 11/17/2009  . DEPRESSION 10/05/1998    Past Surgical History:  Procedure Laterality Date  . CESAREAN SECTION    . TUBAL LIGATION    . WISDOM TOOTH EXTRACTION       OB History    Gravida  3   Para  3   Term  3   Preterm      AB      Living  3     SAB      TAB      Ectopic      Multiple      Live Births               Home Medications    Prior to Admission medications   Medication Sig Start Date End Date Taking? Authorizing Provider  amoxicillin (AMOXIL) 500 MG capsule Take 1 capsule (500 mg total) by mouth 3 (three) times daily. 02/05/13   Noe Gens, PA-C  oxyCODONE-acetaminophen (PERCOCET/ROXICET) 5-325 MG per tablet Take 1-2 pills every 6 hours as needed for pain 02/05/13   Noe Gens, PA-C    Family History No family history on file.  Social History Social History   Tobacco Use  . Smoking status: Current Every Day Smoker  . Smokeless tobacco: Never Used  Substance Use Topics   . Alcohol use: No  . Drug use: No     Allergies   Aspirin   Review of Systems Review of Systems Ten systems reviewed and are negative for acute change, except as noted in the HPI.    Physical Exam Updated Vital Signs BP 98/71   Pulse 79   Temp 98.2 F (36.8 C) (Oral)   Resp 18   LMP 12/11/2011   SpO2 99%   Physical Exam Vitals signs and nursing note reviewed.  Constitutional:      General: She is not in acute distress.    Appearance: She is well-developed. She is not diaphoretic.     Comments: Nontoxic appearing and in NAD  HENT:     Head: Normocephalic and atraumatic.  Eyes:     General: No scleral icterus.    Conjunctiva/sclera: Conjunctivae normal.  Neck:     Musculoskeletal: Normal range of motion.  Cardiovascular:     Rate and Rhythm: Normal rate and regular rhythm.     Pulses: Normal pulses.     Comments: DP pulse 2+ in the R foot. Pulmonary:     Effort: Pulmonary effort is normal. No respiratory distress.  Musculoskeletal:     Right foot: Tenderness present. No swelling, crepitus or deformity.       Feet:  Skin:    General: Skin is warm and dry.     Coloration: Skin is not pale.     Findings: No erythema or rash.  Neurological:     General: No focal deficit present.     Mental Status: She is alert and oriented to person, place, and time.     Coordination: Coordination normal.     Comments: Sensation to light touch intact in the RLE. Patient able to wiggle all toes.  Psychiatric:        Behavior: Behavior normal.      ED Treatments / Results  Labs (all labs ordered are listed, but only abnormal results are displayed) Labs Reviewed - No data to display  EKG None  Radiology Dg Foot Complete Right  Result Date: 06/17/2019 CLINICAL DATA:  Right foot pain for days, no known injury. EXAM: RIGHT FOOT COMPLETE - 3+ VIEW COMPARISON:  None. FINDINGS: There is no evidence of fracture or dislocation. There is no evidence of arthropathy or other  focal bone abnormality. Soft tissues are unremarkable. IMPRESSION: Negative radiographs of the right foot. Electronically Signed   By: Narda RutherfordMelanie  Sanford M.D.   On: 06/17/2019 02:12    Procedures Procedures (including critical care time)  Medications Ordered in ED Medications - No data to display   Initial Impression / Assessment and Plan / ED Course  I have reviewed the triage vital signs and the nursing notes.  Pertinent labs & imaging results that were available during my care of the patient were reviewed by me and considered in my medical decision making (see chart for details).        Patient presents to the emergency department for evaluation of R foot pain x 2 days. Patient neurovascularly intact on exam. Imaging negative for fracture, dislocation, bony deformity. No swelling, erythema, heat to touch to the affected area; no concern for septic joint. Compartments in the affected extremity are soft.   Her physical exam is clinically c/w cuboid syndrome. Plan for supportive management including RICE and NSAIDs; primary care follow up as needed. Return precautions discussed and provided. Patient discharged in stable condition with no unaddressed concerns.   Final Clinical Impressions(s) / ED Diagnoses   Final diagnoses:  Cuboid syndrome of right foot    ED Discharge Orders    None       Antony MaduraHumes, Ellaree Gear, PA-C 06/17/19 0419    Nira Connardama, Pedro Eduardo, MD 06/18/19 [phone]

## 2019-06-17 NOTE — Discharge Instructions (Addendum)
We recommend the use of ibuprofen or Aleve as instructed on the box/bottle.  Try rolling your foot over a hard surface such as a tennis ball.  Ice your foot 3-4 times per day y for 15 to 20 minutes each time to help improve inflammation.  You may follow-up with podiatry for any recurrent or persistent pain.

## 2019-06-17 NOTE — ED Triage Notes (Signed)
Right lateral foot pain for several days. Denies injury. Pain increased with movement and ambulation.

## 2019-08-11 ENCOUNTER — Ambulatory Visit (LOCAL_COMMUNITY_HEALTH_CENTER): Payer: Self-pay

## 2019-08-11 ENCOUNTER — Other Ambulatory Visit: Payer: Self-pay

## 2019-08-11 DIAGNOSIS — Z111 Encounter for screening for respiratory tuberculosis: Secondary | ICD-10-CM

## 2019-08-14 ENCOUNTER — Ambulatory Visit (LOCAL_COMMUNITY_HEALTH_CENTER): Payer: Self-pay

## 2019-08-14 ENCOUNTER — Other Ambulatory Visit: Payer: Self-pay

## 2019-08-14 DIAGNOSIS — Z111 Encounter for screening for respiratory tuberculosis: Secondary | ICD-10-CM

## 2019-08-14 LAB — TB SKIN TEST
Induration: 0 mm
TB Skin Test: NEGATIVE

## 2019-08-28 ENCOUNTER — Other Ambulatory Visit: Payer: Self-pay

## 2019-09-04 ENCOUNTER — Other Ambulatory Visit: Payer: Self-pay

## 2020-04-16 ENCOUNTER — Ambulatory Visit (LOCAL_COMMUNITY_HEALTH_CENTER): Payer: Self-pay

## 2020-04-16 ENCOUNTER — Other Ambulatory Visit: Payer: Self-pay

## 2020-04-16 DIAGNOSIS — Z111 Encounter for screening for respiratory tuberculosis: Secondary | ICD-10-CM

## 2020-04-19 ENCOUNTER — Other Ambulatory Visit: Payer: Self-pay

## 2020-04-19 ENCOUNTER — Ambulatory Visit (LOCAL_COMMUNITY_HEALTH_CENTER): Payer: Self-pay

## 2020-04-19 DIAGNOSIS — Z111 Encounter for screening for respiratory tuberculosis: Secondary | ICD-10-CM

## 2020-04-19 DIAGNOSIS — Z23 Encounter for immunization: Secondary | ICD-10-CM

## 2020-04-19 LAB — TB SKIN TEST
Induration: 0 mm
TB Skin Test: NEGATIVE

## 2020-05-03 ENCOUNTER — Other Ambulatory Visit: Payer: Self-pay

## 2020-05-17 ENCOUNTER — Ambulatory Visit: Payer: Self-pay

## 2022-03-07 ENCOUNTER — Emergency Department: Payer: Self-pay

## 2022-03-07 ENCOUNTER — Emergency Department
Admission: EM | Admit: 2022-03-07 | Discharge: 2022-03-07 | Disposition: A | Discharge status: Home / Self Care | Payer: Self-pay | Attending: Emergency Medicine | Admitting: Emergency Medicine

## 2022-03-07 ENCOUNTER — Encounter: Payer: Self-pay | Admitting: Medical Oncology

## 2022-03-07 DIAGNOSIS — M5136 Other intervertebral disc degeneration, lumbar region: Secondary | ICD-10-CM | POA: Insufficient documentation

## 2022-03-07 DIAGNOSIS — X111XXA Contact with running hot water, initial encounter: Secondary | ICD-10-CM | POA: Insufficient documentation

## 2022-03-07 DIAGNOSIS — M25561 Pain in right knee: Secondary | ICD-10-CM | POA: Insufficient documentation

## 2022-03-07 DIAGNOSIS — T25221A Burn of second degree of right foot, initial encounter: Secondary | ICD-10-CM | POA: Insufficient documentation

## 2022-03-07 DIAGNOSIS — Y990 Civilian activity done for income or pay: Secondary | ICD-10-CM | POA: Insufficient documentation

## 2022-03-07 DIAGNOSIS — T310 Burns involving less than 10% of body surface: Secondary | ICD-10-CM | POA: Insufficient documentation

## 2022-03-07 MED ORDER — NABUMETONE 750 MG PO TABS: 750.0000 mg | ORAL_TABLET | Freq: Every day | ORAL | 2 refills | Status: AC

## 2022-03-07 MED ORDER — LIDOCAINE 5 % EX PTCH
1.0000 | MEDICATED_PATCH | Freq: Once | CUTANEOUS | Status: DC
  Administered 2022-03-07: 1 via TRANSDERMAL
  Filled 2022-03-07: qty 1

## 2022-03-07 MED ORDER — CYCLOBENZAPRINE HCL 5 MG PO TABS: 5.0000 mg | ORAL_TABLET | Freq: Three times a day (TID) | ORAL | 0 refills | Status: DC | PRN

## 2022-03-07 MED ORDER — LIDOCAINE 5 % EX PTCH: 1.0000 | MEDICATED_PATCH | Freq: Two times a day (BID) | CUTANEOUS | 0 refills | Status: AC | PRN
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
## 2022-03-07 NOTE — Discharge Instructions (Addendum)
Your x-rays are normal and reassuring at this time.  You have some mild findings in the low back.  No fracture or dislocation to the back or the knee.  Take the prescription meds as directed.  Keep the burn to the foot clean, dry, and covered.  Follow-up with primary provider or local community clinic for ongoing symptoms.

## 2022-03-07 NOTE — ED Notes (Signed)
Pt sleeping in bed and was provided with warm blanket earlier per request.
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
## 2022-07-27 ENCOUNTER — Encounter (HOSPITAL_COMMUNITY): Payer: Self-pay

## 2022-07-27 ENCOUNTER — Telehealth: Payer: Self-pay | Admitting: Surgery

## 2022-07-27 ENCOUNTER — Emergency Department (HOSPITAL_COMMUNITY)
Admission: EM | Admit: 2022-07-27 | Discharge: 2022-07-27 | Disposition: A | Discharge status: Home / Self Care | Payer: 59 | Attending: Emergency Medicine | Admitting: Emergency Medicine

## 2022-07-27 ENCOUNTER — Other Ambulatory Visit: Payer: Self-pay

## 2022-07-27 DIAGNOSIS — M5442 Lumbago with sciatica, left side: Secondary | ICD-10-CM | POA: Insufficient documentation

## 2022-07-27 DIAGNOSIS — M549 Dorsalgia, unspecified: Secondary | ICD-10-CM | POA: Diagnosis not present

## 2022-07-27 DIAGNOSIS — A5901 Trichomonal vulvovaginitis: Secondary | ICD-10-CM | POA: Insufficient documentation

## 2022-07-27 DIAGNOSIS — G8929 Other chronic pain: Secondary | ICD-10-CM

## 2022-07-27 LAB — URINALYSIS, ROUTINE W REFLEX MICROSCOPIC
Bilirubin Urine: NEGATIVE
Glucose, UA: NEGATIVE mg/dL
Hgb urine dipstick: NEGATIVE
Ketones, ur: NEGATIVE mg/dL
Nitrite: NEGATIVE
Protein, ur: NEGATIVE mg/dL
Specific Gravity, Urine: 1.025 (ref 1.005–1.030)
pH: 6 (ref 5.0–8.0)

## 2022-07-27 LAB — URINALYSIS, MICROSCOPIC (REFLEX)

## 2022-07-27 LAB — WET PREP, GENITAL
Clue Cells Wet Prep HPF POC: NONE SEEN
Sperm: NONE SEEN
WBC, Wet Prep HPF POC: >=10 — AB (ref <10)
Yeast Wet Prep HPF POC: NONE SEEN

## 2022-07-27 MED ORDER — METHOCARBAMOL 1000 MG/10ML IJ SOLN
1000.0000 mg | Freq: Once | INTRAMUSCULAR | Status: DC
  Filled 2022-07-27: qty 10

## 2022-07-27 MED ORDER — METHOCARBAMOL 500 MG PO TABS: 500.0000 mg | ORAL_TABLET | Freq: Three times a day (TID) | ORAL | 0 refills | Status: DC | PRN

## 2022-07-27 MED ORDER — ORPHENADRINE CITRATE 30 MG/ML IJ SOLN
60.0000 mg | Freq: Two times a day (BID) | INTRAMUSCULAR | Status: DC
Start: 2022-07-27 — End: 2022-07-27

## 2022-07-27 MED ORDER — METHYLPREDNISOLONE 4 MG PO TBPK: ORAL_TABLET | ORAL | 0 refills | Status: DC

## 2022-07-27 MED ORDER — CEFTRIAXONE SODIUM 500 MG IJ SOLR
500.0000 mg | Freq: Once | INTRAMUSCULAR | Status: AC
  Administered 2022-07-27: 500 mg via INTRAMUSCULAR
  Filled 2022-07-27: qty 500

## 2022-07-27 MED ORDER — METRONIDAZOLE 500 MG PO TABS
500.0000 mg | ORAL_TABLET | Freq: Once | ORAL | Status: AC
  Administered 2022-07-27: 500 mg via ORAL
  Filled 2022-07-27: qty 1

## 2022-07-27 MED ORDER — DOXYCYCLINE HYCLATE 100 MG PO CAPS
100.0000 mg | ORAL_CAPSULE | Freq: Two times a day (BID) | ORAL | 0 refills | Status: AC
Start: 2022-07-27 — End: 2022-08-03

## 2022-07-27 MED ORDER — LIDOCAINE 5 % EX PTCH: 1.0000 | MEDICATED_PATCH | CUTANEOUS | 0 refills | Status: AC

## 2022-07-27 MED ORDER — LIDOCAINE 5 % EX PTCH
1.0000 | MEDICATED_PATCH | CUTANEOUS | Status: DC
  Administered 2022-07-27: 1 via TRANSDERMAL
  Filled 2022-07-27: qty 1

## 2022-07-27 MED ORDER — METHOCARBAMOL 1000 MG/10ML IJ SOLN
500.0000 mg | Freq: Once | INTRAMUSCULAR | Status: AC
  Administered 2022-07-27: 500 mg via INTRAMUSCULAR
  Filled 2022-07-27: qty 5

## 2022-07-27 MED ORDER — LIDOCAINE HCL (PF) 1 % IJ SOLN
INTRAMUSCULAR | Status: AC
  Administered 2022-07-27: 1 mL
  Filled 2022-07-27: qty 5

## 2022-07-27 MED ORDER — METRONIDAZOLE 500 MG PO TABS: 500.0000 mg | ORAL_TABLET | Freq: Two times a day (BID) | ORAL | 0 refills | Status: AC

## 2022-07-27 NOTE — ED Provider Notes (Signed)
[facility] EMERGENCY DEPARTMENT Provider Note   CSN: [id number] Arrival date & time: 07/27/22  1241     History  Chief Complaint  Patient presents with   Sciatica   Back Pain    Heather Donaldson is a 56 y.o. female.  Heather Donaldson is a 56 year old female with a history of degenerative disc disease who presents to the emergency department for back pain.  The patient states that she has had this back pain for years, and has maintained the same quality but is worse today prompting her to come into the emergency department.  She has also been having some pain going down her legs, mostly her left leg but sometimes her right.  She states that she was previously seen in the emergency department and prescribed lidocaine patches, Flexeril, and another medication that did not help.  She ran out of these medications since, and has been trying Tylenol and Aleve without relief.  She states that she is in the process of getting insurance at the moment, so she has been unable to see another doctor or spine surgeon for this.  She denies any bowel or bladder incontinence, recent falls or trauma, fevers, IV drug use, anticoagulation, or recent back surgeries.  She does report some urinary frequency and vaginal discharge that has an odor.  She has not been sexually active for 2 years, but is requesting STI testing.  The history is provided by the patient.  Back Pain       Home Medications Prior to Admission medications   Medication Sig Start Date End Date Taking? Authorizing Provider  cyclobenzaprine (FLEXERIL) 5 MG tablet Take 1 tablet (5 mg total) by mouth 3 (three) times daily as needed. 03/07/22   Menshew, Dannielle Karvonen, PA-C  nabumetone (RELAFEN) 750 MG tablet Take 1 tablet (750 mg total) by mouth daily. 03/07/22 03/07/23  Menshew, Dannielle Karvonen, PA-C      Allergies    Aspirin and Celery (apium graveolens var. dulce) skin test    Review of Systems   Review of Systems   Musculoskeletal:  Positive for back pain.    Physical Exam Updated Vital Signs BP 104/73   Pulse 82   Temp 98.5 F (36.9 C)   Resp 15   Ht 5\' 5"  (1.651 m)   Wt 45.8 kg   LMP 12/11/2011   SpO2 100%   BMI 16.81 kg/m   Physical Exam Vitals and nursing note reviewed.  Constitutional:      General: She is not in acute distress.    Appearance: She is not toxic-appearing.  HENT:     Head: Normocephalic and atraumatic.  Eyes:     Extraocular Movements: Extraocular movements intact.  Cardiovascular:     Pulses: Normal pulses.     Heart sounds: Normal heart sounds.  Pulmonary:     Effort: Pulmonary effort is normal.     Breath sounds: Normal breath sounds.  Abdominal:     General: There is no distension.     Palpations: Abdomen is soft.     Tenderness: There is no abdominal tenderness. There is no guarding or rebound.  Musculoskeletal:        General: No deformity or signs of injury. Normal range of motion.     Cervical back: Normal range of motion. No tenderness.     Comments: Left lumbar paraspinal tenderness to palpation.  No midline spinal tenderness to palpation throughout C/T/L-spine.  Negative straight leg raise bilaterally.  Skin:  General: Skin is warm and dry.  Neurological:     General: No focal deficit present.     Mental Status: She is alert and oriented to person, place, and time.     Sensory: No sensory deficit.     Motor: No weakness.     Comments: Sensation intact throughout.  5/5 strength in bilateral lower extremities including hip flexion and extension, knee flexion and extension, and plantarflexion and dorsiflexion.     ED Results / Procedures / Treatments   Labs (all labs ordered are listed, but only abnormal results are displayed) Labs Reviewed  URINALYSIS, ROUTINE W REFLEX MICROSCOPIC - Abnormal; Notable for the following components:      Result Value   Leukocytes,Ua TRACE (*)    All other components within normal limits  URINALYSIS,  MICROSCOPIC (REFLEX) - Abnormal; Notable for the following components:   Bacteria, UA RARE (*)    All other components within normal limits  WET PREP, GENITAL  GC/CHLAMYDIA PROBE AMP ([place]) NOT AT [facility]    EKG None  Radiology No results found.  Procedures Procedures    Medications Ordered in ED Medications - No data to display  ED Course/ Medical Decision Making/ A&P                           Medical Decision Making Amount and/or Complexity of Data Reviewed Labs: ordered.  Risk Prescription drug management.   Heather Donaldson presents with back pain for several years as per above.  I have reviewed the nursing documentation for past medical history, family history, and social history.   The patient is able to ambulate and is hemodynamically stable. There are no red flag symptoms. Specifically, she denies: -Being on an anticoagulant or blood thinner -Using IV drugs -Having a history of AAA -Having saddle or perianal anesthesia -Having urinary or fecal incontinence or urinary retention -Having any recent falls or trauma  Differential Diagnosis:  I do not think that this is cauda equina syndrome or cord compression, as the patient does not have + SLR, lower extremity weakness, urinary retention, saddle/perianal anesthesia, or numbness or tingling. The patient is able to ambulate. There is no midline spinal point tenderness to suggest acute fracture or spinal epidural abscess, and the patient is afebrile with no history of IV drug use.  The lack of fever or spinal tenderness also decreases my suspicion for discitis.  The patient does not have a history of osteoporosis or chronic steroid use.  Patient is not on an anticoagulant and has no recent history of back surgery, therefore do not feel concern for spinal hematoma.  Patient does report dysuria, however UA today not remarkable for UTI and patient denies fever or change in symptoms from her degenerative disc disease which  have been present for years, do not suspect nephrolithiasis. The patient is hemodynamically stable with equal pulses, no chest or abdominal pain, and no history of aortic disease, lowering my suspicion for abdominal aortic aneurysm or aortic dissection.  Patient also reports vaginal discharge and a prior history of trichomonas, will perform G/C and wet prep today.  Patient does endorse history of but has no signs of UA on UTI.  She has no pelvic pain, abdominal pain, or abdominal tenderness to palpation or fever to suggest PID or tubo-ovarian abscess.  Testing:  I reviewed patient's lumbar spine performed on 03/07/2022, which shows mild to moderate degenerative disc disease at L5-S1.  Do not feel  that repeat imaging is warranted as patient denies any new falls or trauma, as has no change in pain description.    UA without evidence of UTI on my interpretation given negative nitrites, trace leukocytes without reported WBCs.  Wet prep positive for trichomonas.  ED Course:  Wet prep positive for trichomonas, patient given dose of metronidazole in the ED and sent home with prescription for metronidazole x7 days.  She was instructed to abstain from sexual contact and inform any recent sexual partners, patient denies any recent sexual intercourse. Patient offered treatment for G/C as well and requested this be given. She was given a dose of IM Rocephin in the emergency department as well as a prescription for doxycycline.  Patient given prescription for Medrol Dosepak for symptoms.  Reassessment:  On reassessment, the patient remains well-appearing without neurologic deficits.  Patient given referral to orthopedic spine and instructed on importance of follow-up.  She was also given strict return precautions.  Patient discharged in stable condition.        Final Clinical Impression(s) / ED Diagnoses Final diagnoses:  None    Rx / DC Orders ED Discharge Orders     None         Stephanie Coup, MD 07/27/22 1911    Alvira Monday, MD 07/28/22 1037

## 2022-07-27 NOTE — ED Notes (Signed)
Patient verbalizes understanding of discharge instructions. Opportunity for questioning and answers were provided. Armband removed by staff, pt discharged from ED.  

## 2022-07-27 NOTE — ED Triage Notes (Signed)
Pt c/o lower back pain that is chronic due to DDD. Pt states pain is radiating down both legs. Denies new trauma. No bowel/bladder incontinence, no saddle anesthesia per pt.   Pt also c/o vaginal discharge with "a smell to it". Denies urinary symptoms.

## 2022-07-27 NOTE — Telephone Encounter (Signed)
Referral called into [facility] internal Medicine Clinic for f/u and to establish care.

## 2022-07-27 NOTE — ED Provider Triage Note (Signed)
Emergency Medicine Provider Triage Evaluation Note  Heather Donaldson , a 56 y.o. female  was evaluated in triage.  Pt complains of low back pain (hx of DDD) and states she's had some pain going down her leg from low back since yesterday. Denies any bowel / bladder incontinence. No NV. Also co vaginal DC no new sexual partners. No urinary sx.  Review of Systems  Positive: Vaginal DC, low back pain Negative: Fever   Physical Exam  BP 104/73   Pulse 82   Temp 98.5 F (36.9 C)   Resp 15   Ht 5\' 5"  (1.651 m)   Wt 45.8 kg   LMP 12/11/2011   SpO2 100%   BMI 16.81 kg/m  Gen:   Awake, no distress   Resp:  Normal effort  MSK:   Moves extremities without difficulty  Other:   Medical Decision Making  Medically screening exam initiated at 1:32 PM.  Appropriate orders placed.  Heather Donaldson was informed that the remainder of the evaluation will be completed by another provider, this initial triage assessment does not replace that evaluation, and the importance of remaining in the ED until their evaluation is complete.  Urine, pelvic swabs ordered.    Heather Donaldson, [place] 07/27/22 1334

## 2022-07-27 NOTE — Discharge Instructions (Addendum)
You are seen in the emergency department for back pain and vaginal discharge.  While you were here, we performed testing which showed that you have trichomonas infection.  We gave you a dose of an antibiotic called Flagyl here, and are sending you home with a prescription.  Please take this twice per day as prescribed for total of 7 days.  Please abstain from sexual contact until you have been retested and are negative, and inform any recent or potential sexual partners that they should be tested and treated as well.  We saw no signs of an emergency cause of your back pain today.  We have placed a referral for you to be seen by a neurosurgeon in their clinic.  Please call their clinic to schedule an appointment.  Please return to the emergency department if you have loss of sensation in your legs or around your genital region, loose control of your bowels or bladder, develop new fevers along with your back pain, or if you have any other reason to think you need emergency care.  We hope that you feel better soon.

## 2022-07-28 LAB — GC/CHLAMYDIA PROBE AMP (~~LOC~~) NOT AT ARMC
Chlamydia: NEGATIVE
Comment: NEGATIVE
Comment: NORMAL
Neisseria Gonorrhea: NEGATIVE

## 2022-09-08 DIAGNOSIS — Z681 Body mass index (BMI) 19 or less, adult: Secondary | ICD-10-CM | POA: Diagnosis not present

## 2022-09-08 DIAGNOSIS — M5416 Radiculopathy, lumbar region: Secondary | ICD-10-CM | POA: Diagnosis not present

## 2022-09-24 DIAGNOSIS — M5416 Radiculopathy, lumbar region: Secondary | ICD-10-CM | POA: Diagnosis not present

## 2022-09-24 DIAGNOSIS — M47816 Spondylosis without myelopathy or radiculopathy, lumbar region: Secondary | ICD-10-CM | POA: Diagnosis not present

## 2022-09-24 DIAGNOSIS — M5126 Other intervertebral disc displacement, lumbar region: Secondary | ICD-10-CM | POA: Diagnosis not present

## 2022-12-09 DIAGNOSIS — H47393 Other disorders of optic disc, bilateral: Secondary | ICD-10-CM | POA: Diagnosis not present

## 2022-12-09 DIAGNOSIS — H2513 Age-related nuclear cataract, bilateral: Secondary | ICD-10-CM | POA: Diagnosis not present

## 2022-12-27 IMAGING — CR DG KNEE COMPLETE 4+V*R*
4 series · 4 of 4 positions shown · non-contrast
Comparison: None Available.

CLINICAL DATA: Fall 2 months ago.  Persistent right knee pain.

EXAM:
RIGHT KNEE - COMPLETE 4+ VIEW

[knee ap]
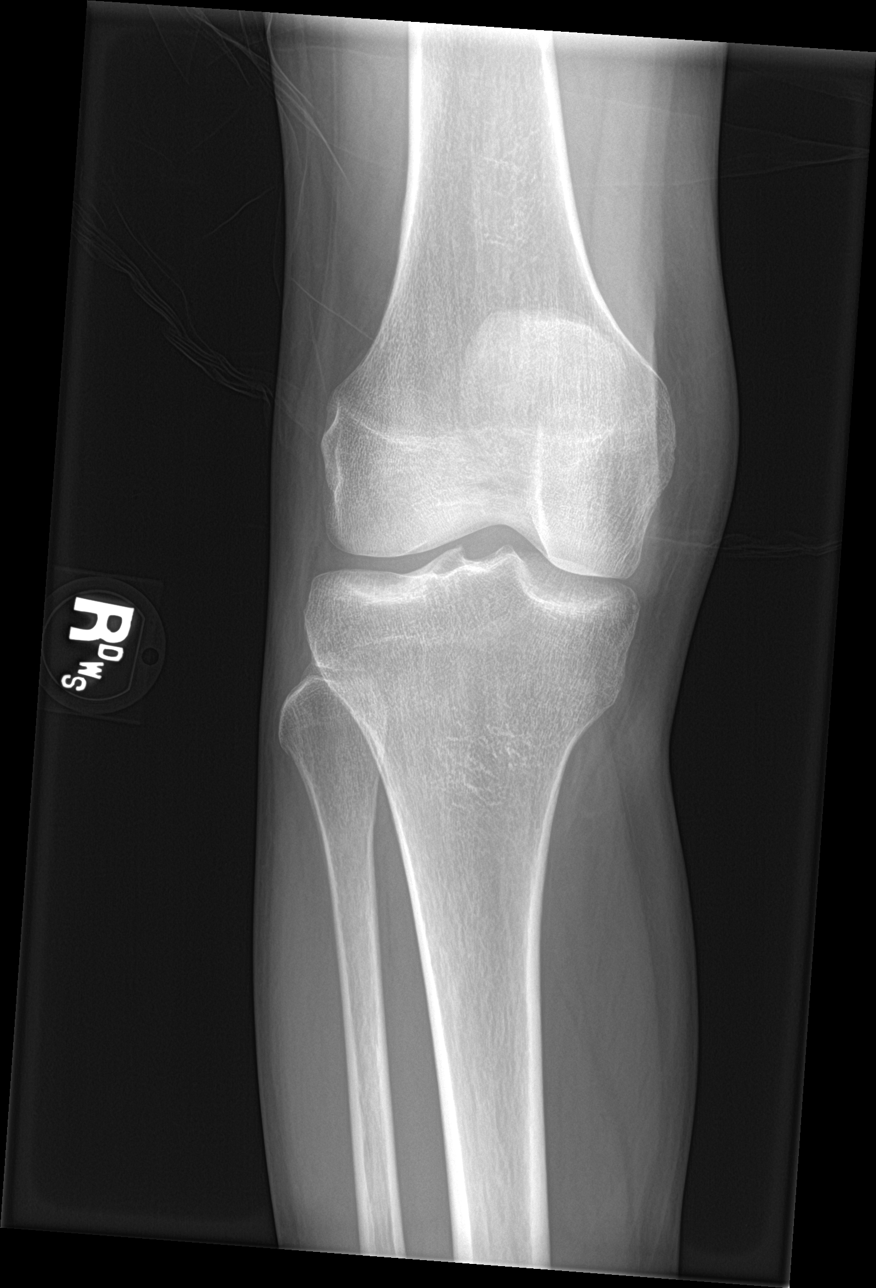

[knee obl (1 of 2)]
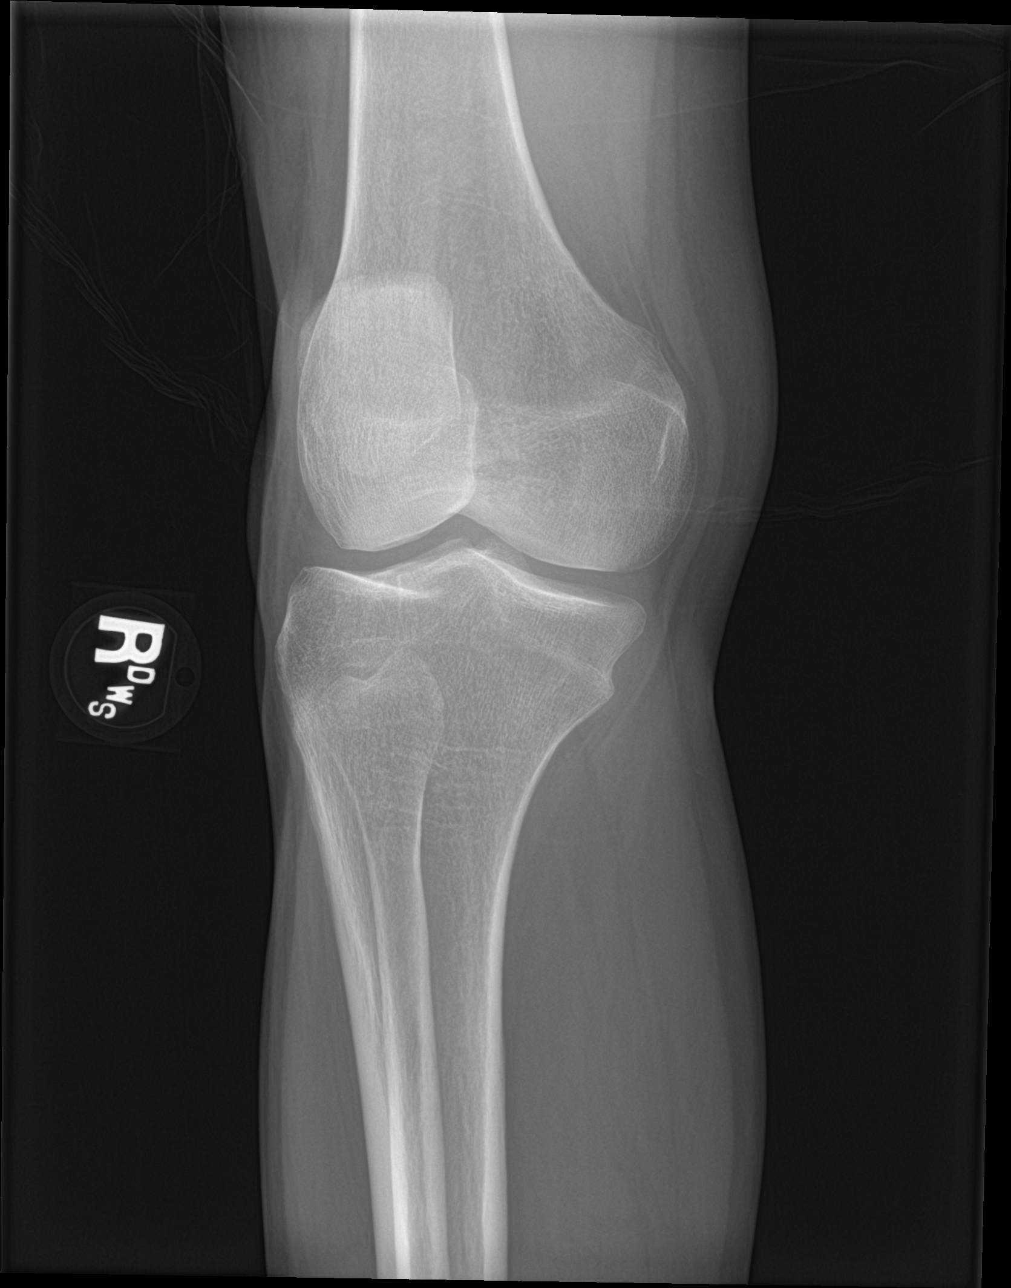

[knee obl (2 of 2)]
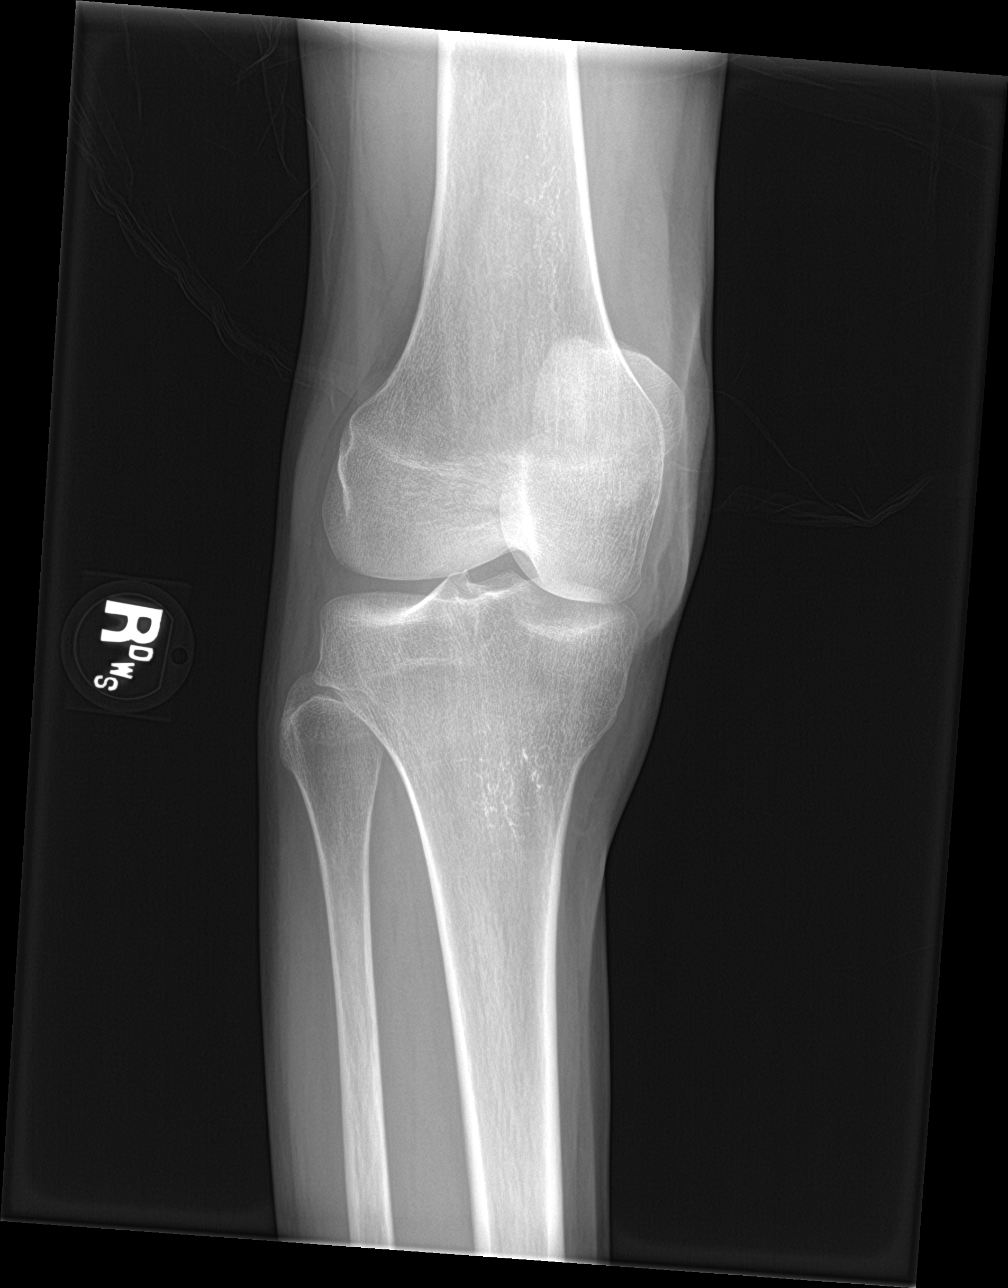

[knee lat]
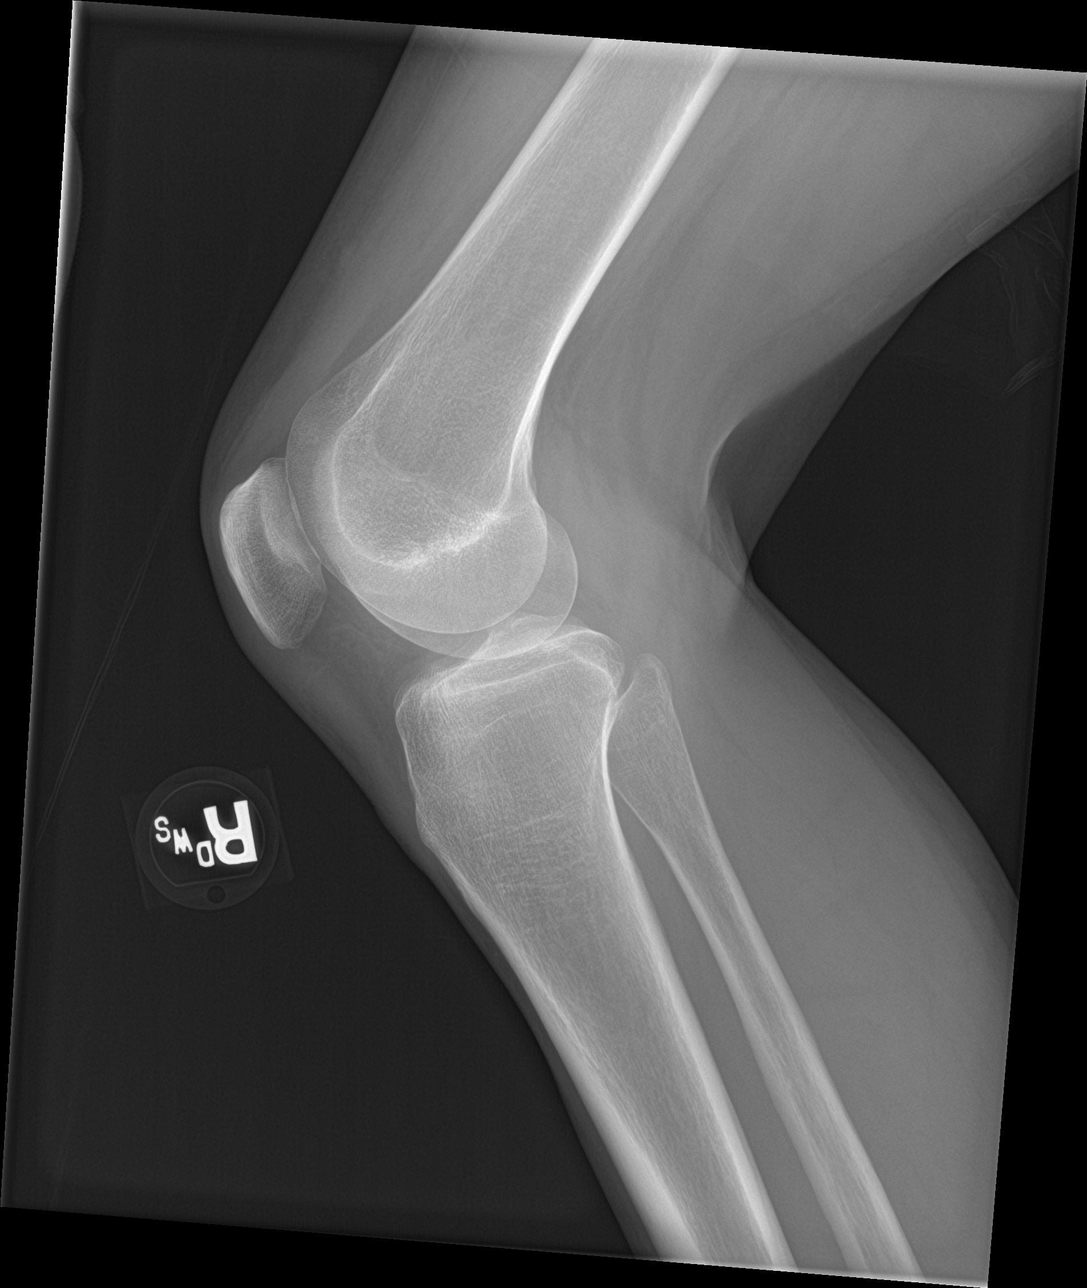

[4 of 4 positions shown; findings below may reference images not displayed]

FINDINGS: No evidence of fracture, dislocation, or joint effusion. No evidence
of arthropathy or other focal bone abnormality. Soft tissues are
unremarkable.
IMPRESSION: Negative.

## 2023-01-18 DIAGNOSIS — H524 Presbyopia: Secondary | ICD-10-CM | POA: Diagnosis not present

## 2024-02-09 ENCOUNTER — Emergency Department (HOSPITAL_COMMUNITY)

## 2024-02-09 ENCOUNTER — Other Ambulatory Visit: Payer: Self-pay

## 2024-02-09 ENCOUNTER — Encounter (HOSPITAL_COMMUNITY): Payer: Self-pay

## 2024-02-09 ENCOUNTER — Emergency Department (HOSPITAL_COMMUNITY)
Admission: EM | Admit: 2024-02-09 | Discharge: 2024-02-09 | Disposition: A | Discharge status: Home / Self Care | Attending: Emergency Medicine | Admitting: Emergency Medicine

## 2024-02-09 DIAGNOSIS — R0789 Other chest pain: Secondary | ICD-10-CM | POA: Diagnosis not present

## 2024-02-09 DIAGNOSIS — M25512 Pain in left shoulder: Secondary | ICD-10-CM | POA: Diagnosis not present

## 2024-02-09 DIAGNOSIS — M25532 Pain in left wrist: Secondary | ICD-10-CM | POA: Insufficient documentation

## 2024-02-09 HISTORY — DX: Other intervertebral disc degeneration, lumbar region without mention of lumbar back pain or lower extremity pain: M51.369

## 2024-02-09 MED ORDER — OXYCODONE-ACETAMINOPHEN 5-325 MG PO TABS
1.0000 | ORAL_TABLET | Freq: Once | ORAL | Status: AC
  Administered 2024-02-09: 1 via ORAL
  Filled 2024-02-09: qty 1

## 2024-02-09 MED ORDER — METHYLPREDNISOLONE 4 MG PO TBPK: ORAL_TABLET | ORAL | 0 refills | Status: AC

## 2024-02-09 NOTE — ED Triage Notes (Addendum)
 Patient has had left breast pain and left shoulder pain for 4 months. Pain comes out of nowhere. Pain starts at her shoulder and moves down to her finger tips. Denies falls. Patient said her left breast has some brown drainage.

## 2024-02-09 NOTE — Progress Notes (Signed)
 Orthopedic Tech Progress Note Patient Details:  Heather Donaldson Apr 01, 1966 [id number]  Ortho Devices Type of Ortho Device: Velcro wrist splint Ortho Device/Splint Location: left Ortho Device/Splint Interventions: Ordered, Application, Adjustment   Post Interventions Patient Tolerated: Well Instructions Provided: Adjustment of device, Care of device  [name] 02/09/2024, 12:24 PM

## 2024-02-09 NOTE — Care Management (Signed)
 Transition of Care [facility]) - Emergency Department Mini Assessment   Patient Details  Name: Heather Donaldson MRN: [id number] Date of Birth: 01-18-1966  [facility] of Care [facility]) CM/SW Contact:    Naoma Bacca, RN Phone Number: 02/09/2024, 12:12 PM   Clinical Narrative: [name] consult for PCP needs. Per chart review patient currently at [facility] ED for pain and discharge of left breast.  This RNCM spoke with patient at bedside who reports she moved from [place] to [place] and no longer sees the PCP in [place]. Patient reports she applied for Medicaid in [facility] 3 weeks ago and waiting update. This RNCM scheduled first available PCP appointment with [facility] on 03/22/24 at 1:30pm, attached AVS. Patient reports she does not have any DME prior to admission. Patient reports she works a job. Patient reports she does not have difficulty affording medications.  Notified MD, RN    Transportation at discharge: patient's son in law  No additional TOC needs   ED Mini Assessment: What brought you to the Emergency Department? : patient reports pain in left breast pain and left shoulder pain for 4 months  Barriers to Discharge: Barriers Resolved  Barrier interventions: coordinating PCP visit  Means of departure: Car  Interventions which prevented an admission or readmission: PCP counseling    Patient Contact and Communications        ,                 Admission diagnosis:  Breast Pain Shoulder Weakness Patient Active Problem List   Diagnosis Date Noted   MUSCLE SPASM, BACK 03/18/2010   HERPES GENITALIS 02/18/2010   TRICHOMONIASIS 02/18/2010   DENTAL CARIES 02/18/2010   HEADACHE 02/18/2010   PANCYTOPENIA 11/29/2009   TOBACCO ABUSE 11/29/2009   PNEUMONIA, LEFT LOWER LOBE 11/17/2009   DEPRESSION 10/05/1998   PCP:  Patient, No Pcp Per Pharmacy:   [facility] [address] ([address]), [place] - [address]  ([address]) [place] [address] Phone: [phone] Fax: [phone]

## 2024-02-09 NOTE — ED Provider Notes (Signed)
 [place] [facility] AT [facility] Provider Note   CSN: [id number] Arrival date & time: 02/09/24  1020     History  Chief Complaint  Patient presents with   Breast Pain    Heather Donaldson is a 58 y.o. female.  HPI Patient presents with various complaints.  Left breast pain.  Left shoulder pain.  Left hand pain.  Also pain in left wrist.  Also tingling in right hand and feels weak in bilateral hands at times.  No weight loss.  Is right-handed.  Has been having some weakness at work.  Mild neck pain.  Also feels his left breast is more swollen and has had some brown nipple discharge.   Past Medical History:  Diagnosis Date   DDD (degenerative disc disease), lumbar    Headache(784.0)    Trichimoniasis     Home Medications Prior to Admission medications   Medication Sig Start Date End Date Taking? Authorizing Provider  lidocaine  (LIDODERM ) 5 % Place 1 patch onto the skin daily. Remove & Discard patch within 12 hours or as directed by MD 07/27/22   Scarlette Currier, MD  methocarbamol  (ROBAXIN ) 500 MG tablet Take 1-2 tablets (500-1,000 mg total) by mouth every 8 (eight) hours as needed for muscle spasms. 07/27/22   Scarlette Currier, MD  methylPREDNISolone  (MEDROL  DOSEPAK) 4 MG TBPK tablet See package instructions 02/09/24   Mozell Arias, MD      Allergies    Aspirin and Celery (apium graveolens var. dulce) skin test    Review of Systems   Review of Systems  Physical Exam Updated Vital Signs BP 132/89   Pulse 80   Temp 97.8 F (36.6 C) (Oral)   Resp 16   Ht 5\' 5"  (1.651 m)   Wt 45 kg   LMP 12/11/2011   SpO2 100%   BMI 16.51 kg/m  Physical Exam Vitals and nursing note reviewed.  HENT:     Head: Atraumatic.  Cardiovascular:     Rate and Rhythm: Regular rhythm.  Pulmonary:     Comments: Tenderness to left upper chest wall.  No rash.  With chaperone breast exam done and showed no clear mass of left breast.  No nipple discharge. Chest:      Chest wall: Tenderness present.  Abdominal:     Tenderness: There is no abdominal tenderness.  Musculoskeletal:     Cervical back: Neck supple.     Comments: Does have pain with prolonged flexion of bilateral wrists.  Particularly on the left hand.  Sensation grossly intact.  Does have tenderness over the left shoulder particular with movement.  Skin:    Capillary Refill: Capillary refill takes less than 2 seconds.  Neurological:     Mental Status: She is alert and oriented to person, place, and time.     ED Results / Procedures / Treatments   Labs (all labs ordered are listed, but only abnormal results are displayed) Labs Reviewed - No data to display  EKG None  Radiology DG Shoulder Left Result Date: 02/09/2024 CLINICAL DATA:  Left breast pain and left shoulder pain. EXAM: LEFT SHOULDER - 2+ VIEW COMPARISON:  None Available. FINDINGS: Frontal and lateral views of left shoulder. Minimal inferior glenoid degenerative spurring. The glenohumeral joint space is maintained. Mild downsloping/spurring of the anterolateral acromion. Normal alignment of the acromioclavicular joint. No acute fracture dislocation. IMPRESSION: 1. Minimal inferior glenoid degenerative spurring. 2. Mild downsloping/spurring of the anterolateral acromion. Electronically Signed   By: Loanne Rim.D.  On: 02/09/2024 11:43   DG Chest 2 View Result Date: 02/09/2024 CLINICAL DATA:  Left breast pain and left shoulder pain for 4 months. EXAM: CHEST - 2 VIEW COMPARISON:  Chest two views 11/10/2009, CT chest 11/10/2009 FINDINGS: Cardiac silhouette and mediastinal contours are within limits. The lungs are clear. Resolution of the prior left lower lung airspace opacity overlying the left heart border on remote 11/10/2009 radiographs and also seen on same-day CT. No pleural effusion or pneumothorax. Minimal dextrocurvature of the mid to lower thoracic spine and mild levocurvature of the thoracolumbar junction, new from 2011 remote  radiographs. Moderate multilevel degenerative disc changes of the thoracic spine, new from prior. IMPRESSION: 1. No active cardiopulmonary disease. 2. Mild S-shaped thoracolumbar scoliosis. Moderate multilevel degenerative disc changes of the thoracic spine, new from prior. Electronically Signed   By: Bertina Broccoli M.D.   On: 02/09/2024 11:43    Procedures Procedures    Medications Ordered in ED Medications  oxyCODONE -acetaminophen  (PERCOCET/ROXICET) 5-325 MG per tablet 1 tablet (has no administration in time range)    ED Course/ Medical Decision Making/ A&P                                 Medical Decision Making Amount and/or Complexity of Data Reviewed Radiology: ordered.  Risk Prescription drug management.   Patient with multiple complaints.  Some neck pain shoulder pain and hand pain.  States hands will feel weak at times.  Differential diagnosis is long but includes causes such as carpal tunnel syndrome.  Particularly in the left hand has pain with flexion at the wrist.  Also the distribution of the symptoms could go along with the median nerve.  But also has shoulder pain.  Will get x-ray of shoulder and chest.  Does have breast tenderness and states swelling but no clear mass seen.  Will treat symptomatically for potential carpal tunnel syndrome and arthritis.  Will need PCP follow-up and transitions of care has been arranged since does not have a PCP.    X-rays reassuring overall.  No malignancy seen.  Does have degeneration in the shoulder likely has carpal tunnel in the hand   will get brace for the left hand.  Will need follow-up with PCP.  Being arranged by TOC.         Final Clinical Impression(s) / ED Diagnoses Final diagnoses:  Wrist pain, acute, left  Acute pain of left shoulder    Rx / DC Orders ED Discharge Orders          Ordered    methylPREDNISolone  (MEDROL  DOSEPAK) 4 MG TBPK tablet        02/09/24 1212              Mozell Arias,  MD 02/09/24 1213

## 2024-03-17 ENCOUNTER — Emergency Department

## 2024-03-17 ENCOUNTER — Other Ambulatory Visit: Payer: Self-pay

## 2024-03-17 ENCOUNTER — Emergency Department
Admission: EM | Admit: 2024-03-17 | Discharge: 2024-03-17 | Disposition: A | Discharge status: Home / Self Care | Attending: Emergency Medicine | Admitting: Emergency Medicine

## 2024-03-17 DIAGNOSIS — M25512 Pain in left shoulder: Secondary | ICD-10-CM | POA: Diagnosis present

## 2024-03-17 LAB — BASIC METABOLIC PANEL WITH GFR
Anion gap: 11 (ref 5–15)
BUN: 17 mg/dL (ref 6–20)
CO2: 21 mmol/L — ABNORMAL LOW (ref 22–32)
Calcium: 9.2 mg/dL (ref 8.9–10.3)
Chloride: 105 mmol/L (ref 98–111)
Creatinine, Ser: 0.58 mg/dL (ref 0.44–1.00)
GFR, Estimated: >60 mL/min (ref >60)
Glucose, Bld: 96 mg/dL (ref 70–99)
Potassium: 3.9 mmol/L (ref 3.5–5.1)
Sodium: 137 mmol/L (ref 135–145)

## 2024-03-17 LAB — CBC
HCT: 34.9 % — ABNORMAL LOW (ref 36.0–46.0)
Hemoglobin: 11.9 g/dL — ABNORMAL LOW (ref 12.0–15.0)
MCH: 32.5 pg (ref 26.0–34.0)
MCHC: 34.1 g/dL (ref 30.0–36.0)
MCV: 95.4 fL (ref 80.0–100.0)
Platelets: 228 10*3/uL (ref 150–400)
RBC: 3.66 MIL/uL — ABNORMAL LOW (ref 3.87–5.11)
RDW: 11.7 % (ref 11.5–15.5)
WBC: 5.2 10*3/uL (ref 4.0–10.5)
nRBC: 0 % (ref 0.0–0.2)

## 2024-03-17 LAB — TROPONIN I (HIGH SENSITIVITY): Troponin I (High Sensitivity): 3 ng/L (ref <18)

## 2024-03-17 MED ORDER — OXYCODONE-ACETAMINOPHEN 5-325 MG PO TABS
1.0000 | ORAL_TABLET | ORAL | Status: AC
  Administered 2024-03-17: 1 via ORAL
  Filled 2024-03-17: qty 1

## 2024-03-17 MED ORDER — OXYCODONE-ACETAMINOPHEN 5-325 MG PO TABS: 1.0000 | ORAL_TABLET | Freq: Four times a day (QID) | ORAL | 0 refills | Status: AC | PRN

## 2024-03-17 MED ORDER — PREDNISONE 20 MG PO TABS
40.0000 mg | ORAL_TABLET | Freq: Once | ORAL | Status: AC
  Administered 2024-03-17: 40 mg via ORAL
  Filled 2024-03-17: qty 2

## 2024-03-17 NOTE — ED Triage Notes (Signed)
 Pt to ED for c/o left shoulder and arm pain rated at 10. Pt seen at [facility] in May and had imaging done, states she was told she had a bad shoulder, but does not know what's wrong with it. States pain has continued to get worse and is now excruciating. Pt crying in triage. Pt A&O.

## 2024-03-17 NOTE — ED Provider Notes (Signed)
 [facility] Provider Note    Event Date/Time   First MD Initiated Contact with Patient 03/17/24 1415     (approximate)   History   Shoulder Pain   HPI  Heather Donaldson is a 58 y.o. female   who reports that she has been having a couple months of pain in her left shoulder.  The pain started a few months ago it shoots from her left shoulder towards her left elbow and some towards her left neck.  It is worse when she bends her neck or moves the left shoulder.  At one point was causing a tingling feeling in her left hand.  She went to the ER was diagnosed with carpal tunnel.  She did take steroid medicine and reports that did not seem to help too much.  The pain has persisted since then.  She reports the pain continues to be excruciating.    No other pain or difficulty.  No chest pain no difficulty breathing.  No numbness or weakness except a month ago it felt like a tingling in her hand but that is gotten better.  The pain is not associated with any fall or injury.  She has not had any cold or change in sensation in the hand.  Physical Exam   Triage Vital Signs: ED Triage Vitals  Encounter Vitals Group     BP 03/17/24 1331 (!) 147/101     Girls Systolic BP Percentile --      Girls Diastolic BP Percentile --      Boys Systolic BP Percentile --      Boys Diastolic BP Percentile --      Pulse Rate 03/17/24 1331 88     Resp 03/17/24 1331 20     Temp 03/17/24 1331 98.6 F (37 C)     Temp Source 03/17/24 1331 Oral     SpO2 03/17/24 1331 100 %     Weight 03/17/24 1333 107 lb (48.5 kg)     Height 03/17/24 1333 5' 1 (1.549 m)     Head Circumference --      Peak Flow --      Pain Score 03/17/24 1332 10     Pain Loc --      Pain Education --      Exclude from Growth Chart --     Most recent vital signs: Vitals:   03/17/24 1331  BP: (!) 147/101  Pulse: 88  Resp: 20  Temp: 98.6 F (37 C)  SpO2: 100%  No fever   General: Awake, no distress.  She  does appear in quite a bit of pain when she tries to range the left shoulder she reports it is quite painful limitation significant with trying to range the left shoulder joint CV:  Good peripheral perfusion.  Strong palpable left radial pulse.  Normal perfusion all digits left hand.  Patient able to demonstrate normal use of left hand sensation Resp:  Normal effort.  Clear bilateral Abd:  No distention.  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  Compared with previous EKG from several years ago appears to have similar morphology.  No evidence of acute cardiac ischemia.   RADIOLOGY *** {  USE THE WORD INTERPRETED!! You MUST document your own interpretation of imaging, as well as the fact that you reviewed the radiologist's report!:1}   PROCEDURES:  Critical Care performed: {CriticalCareYesNo:19197::Yes, see critical care procedure note(s),No}  Procedures   MEDICATIONS ORDERED IN ED: Medications - No data to display   IMPRESSION / MDM / ASSESSMENT AND PLAN / ED COURSE  I reviewed the triage vital signs and the nursing notes.                              Differential diagnosis includes, but is not limited to, ***  Patient's presentation is most consistent with {EM COPA:27473}  *** {If the patient is on the monitor, remove the brackets and asterisks on the sentence below and remember to document it as a Procedure as well. Otherwise delete the sentence below:1} {**The patient is on the cardiac monitor to evaluate for evidence of arrhythmia and/or significant heart rate changes.**} {Remember to include, when applicable, any/all of the following data: independent review of imaging independent review of labs (comment  specifically on pertinent positives and negatives) review of specific prior hospitalizations, PCP/specialist notes, etc. discuss meds given and prescribed document any discussion with consultants (including hospitalists) any clinical decision tools you used and why (PECARN, NEXUS, etc.) did you consider admitting the patient? document social determinants of health affecting patient's care (homelessness, inability to follow up in a timely fashion, etc) document any pre-existing conditions increasing risk on current visit (e.g. diabetes and HTN increasing danger of high-risk chest pain/ACS) describes what meds you gave (especially parenteral) and why any other interventions?:1} Clinical Course as of 03/17/24 1606  Fri Mar 17, 2024  1445 Reports has PCP visit June 18th and plans to get a referral to ortho specialist from there.  [MQ]  1520 After reviewing chest x-ray and patient's previous evaluation, I think it would be reasonable to broaden her evaluation and differential.  I suspect if we had reassuring CBC, normal troponin this would be extremely effective in excluding ACS as cause of her pain.  I am fairly certain her pain is originating from her left shoulder area but given her clinical history and age we will assess.  Will also obtain noncontrast imaging of this shoulder with CT to evaluate for possible effusion or other bony arthropathy [MQ]  1521 Patient is neurovascularly intact.  No back pain, no shoulder pain, no signs or symptoms of be concerning or point towards [MQ]  1521  acute aortic pathology at this point [MQ]    Clinical Course User Index [MQ] Iver Marker, MD     FINAL CLINICAL IMPRESSION(S) / ED DIAGNOSES   Final diagnoses:  None     Rx / DC Orders   ED Discharge Orders     None        Note:  This document was prepared using Dragon voice recognition software and may include unintentional dictation errors.

## 2024-03-17 NOTE — ED Triage Notes (Signed)
 First nurse note; Pt here from [facility] with L severe arm pain that radiates down arm. Pt seen for same in [place] about 1 month ago. No injury noted.

## 2024-03-17 NOTE — Discharge Instructions (Signed)
 No driving today or within 8 hours of use of oxycodone .  Use only as prescribed do not mix with any other muscle relaxants, sedatives, prescription pain medications, or alcohol.

## 2024-03-21 ENCOUNTER — Telehealth (INDEPENDENT_AMBULATORY_CARE_PROVIDER_SITE_OTHER): Payer: Self-pay | Admitting: Primary Care

## 2024-03-21 NOTE — Telephone Encounter (Signed)
 Called pt to confirm appt. Pt did not answer and LVM

## 2024-03-22 ENCOUNTER — Encounter (INDEPENDENT_AMBULATORY_CARE_PROVIDER_SITE_OTHER): Payer: Self-pay

## 2024-03-22 ENCOUNTER — Ambulatory Visit (INDEPENDENT_AMBULATORY_CARE_PROVIDER_SITE_OTHER): Admitting: Primary Care

## 2024-03-23 ENCOUNTER — Telehealth (INDEPENDENT_AMBULATORY_CARE_PROVIDER_SITE_OTHER): Payer: Self-pay | Admitting: Primary Care

## 2024-03-23 NOTE — Telephone Encounter (Signed)
 Called pt to see if interested in rescheduling appt that was missed. Pt was able to reschedule at this time.

## 2024-03-24 ENCOUNTER — Ambulatory Visit (INDEPENDENT_AMBULATORY_CARE_PROVIDER_SITE_OTHER): Admitting: Primary Care

## 2024-03-24 ENCOUNTER — Telehealth (INDEPENDENT_AMBULATORY_CARE_PROVIDER_SITE_OTHER): Payer: Self-pay | Admitting: Primary Care

## 2024-03-24 NOTE — Telephone Encounter (Signed)
 Called pt to reschedule appt. Pt did not answer and LVM

## 2024-03-27 ENCOUNTER — Other Ambulatory Visit: Payer: Self-pay | Admitting: Student

## 2024-03-27 DIAGNOSIS — M5412 Radiculopathy, cervical region: Secondary | ICD-10-CM

## 2024-04-21 ENCOUNTER — Other Ambulatory Visit: Payer: Self-pay

## 2024-04-21 ENCOUNTER — Emergency Department (HOSPITAL_COMMUNITY)
Admission: EM | Admit: 2024-04-21 | Discharge: 2024-04-21 | Disposition: A | Discharge status: Home / Self Care | Attending: Emergency Medicine | Admitting: Emergency Medicine

## 2024-04-21 ENCOUNTER — Encounter (HOSPITAL_COMMUNITY): Payer: Self-pay

## 2024-04-21 ENCOUNTER — Emergency Department (HOSPITAL_COMMUNITY)

## 2024-04-21 DIAGNOSIS — M545 Low back pain, unspecified: Secondary | ICD-10-CM | POA: Insufficient documentation

## 2024-04-21 DIAGNOSIS — G8929 Other chronic pain: Secondary | ICD-10-CM

## 2024-04-21 MED ORDER — MORPHINE SULFATE (PF) 4 MG/ML IV SOLN
4.0000 mg | Freq: Once | INTRAVENOUS | Status: AC
  Administered 2024-04-21: 4 mg via INTRAVENOUS
  Filled 2024-04-21: qty 1

## 2024-04-21 MED ORDER — METHOCARBAMOL 500 MG PO TABS: 500.0000 mg | ORAL_TABLET | Freq: Three times a day (TID) | ORAL | 0 refills | Status: DC | PRN

## 2024-04-21 MED ORDER — CYCLOBENZAPRINE HCL 10 MG PO TABS: 10.0000 mg | ORAL_TABLET | Freq: Two times a day (BID) | ORAL | 0 refills | Status: AC | PRN

## 2024-04-21 NOTE — ED Triage Notes (Signed)
 Pt arrived EMS for chronic back pain, started again today. No new injuries. Denies pain radiating. No numbness or tingling. No changes in bowel or bladder

## 2024-04-21 NOTE — Discharge Instructions (Addendum)
 You were evaluated in the emergency room for low back pain.  Your imaging did not show any significant abnormality.  A prescription for Robaxin  was sent into your pharmacy.  Please avoid driving or operating heavy machinery while using this medication as it may cause drowsiness.  You may additionally alternate Tylenol  and ibuprofen as needed for pain.  Please follow-up with your orthopedic doctor for further management.

## 2024-04-21 NOTE — ED Provider Notes (Signed)
 [place] EM[facility] DEPARTMENT AT [facility] Provider Note   CSN: [id number] Arrival date & time: 04/21/24  1452     Patient presents with: Back Pain   Heather Donaldson is a 58 y.o. female with history of degenerative disc disease presents with complaints of low back pain.  Reports she will have similar pain intermittently for years now.  She denies any radicular symptoms.  She has been following with Ortho.  Plan is for outpatient MRI pending insurance approval.  No IV drug use.  No history of cancer.  No unintentional weight loss.  No saddle anesthesia.  No urinary symptoms.    Back Pain  Past Medical History:  Diagnosis Date   DDD (degenerative disc disease), lumbar    Headache(784.0)    Trichimoniasis        Prior to Admission medications   Medication Sig Start Date End Date Taking? Authorizing Provider  lidocaine  (LIDODERM ) 5 % Place 1 patch onto the skin daily. Remove & Discard patch within 12 hours or as directed by MD 07/27/22   Dreama Longs, MD  methocarbamol  (ROBAXIN ) 500 MG tablet Take 1-2 tablets (500-1,000 mg total) by mouth every 8 (eight) hours as needed for muscle spasms. 07/27/22   Dreama Longs, MD  methylPREDNISolone  (MEDROL  DOSEPAK) 4 MG TBPK tablet See package instructions 02/09/24   Patsey Lot, MD  oxyCODONE -acetaminophen  (PERCOCET/ROXICET) 5-325 MG tablet Take 1 tablet by mouth every 6 (six) hours as needed for severe pain (pain score 7-10). 03/17/24   Dicky Anes, MD    Allergies: Aspirin and Celery (apium graveolens var. dulce) skin test    Review of Systems  Musculoskeletal:  Positive for back pain.    Updated Vital Signs BP 106/72 (BP Location: Right Arm)   Pulse 71   Temp 98 F (36.7 C) (Oral)   Resp 18   Ht 5' 1 (1.549 m)   Wt 49.9 kg   LMP 12/11/2011   SpO2 94%   BMI 20.78 kg/m   Physical Exam Vitals and nursing note reviewed.  Constitutional:      General: She is not in acute distress.    Appearance: She is  well-developed.  HENT:     Head: Normocephalic and atraumatic.  Eyes:     Conjunctiva/sclera: Conjunctivae normal.  Cardiovascular:     Rate and Rhythm: Normal rate.     Pulses: Normal pulses.  Pulmonary:     Effort: Pulmonary effort is normal. No respiratory distress.  Musculoskeletal:     Cervical back: Neck supple.     Comments: Tenderness over left lumbar paraspinal and iliac crest.  5 out of 5 lower extremity strength, sensation intact, ambulates without difficulty.  Positive straight leg raise  Skin:    General: Skin is warm and dry.     Capillary Refill: Capillary refill takes less than 2 seconds.  Neurological:     Mental Status: She is alert.  Psychiatric:        Mood and Affect: Mood normal.     (all labs ordered are listed, but only abnormal results are displayed) Labs Reviewed - No data to display  EKG: None  Radiology: DG Pelvis 1-2 Views Result Date: 04/21/2024 CLINICAL DATA:  left iliac pain EXAM: PELVIS - 1-2 VIEW COMPARISON:  January 03, 2012 FINDINGS: No evidence of pelvic fracture or diastasis.No acute hip fracture or dislocation.Lumbar spondylosis, partially visualized.Soft tissues are unremarkable. IMPRESSION: No acute fracture, pelvic bone diastasis, or dislocation. Electronically Signed   By: Rogelia Carlean HERO.D.  On: 04/21/2024 17:35     Procedures   Medications Ordered in the ED  morphine  (PF) 4 MG/ML injection 4 mg (4 mg Intravenous Given 04/21/24 1653)                                    Medical Decision Making Amount and/or Complexity of Data Reviewed Radiology: ordered.  Risk Prescription drug management.   This patient presents to the ED with chief complaint(s) of Back pain .  The complaint involves an extensive differential diagnosis and also carries with it a high risk of complications and morbidity.   Pertinent past medical history as listed in HPI  The differential diagnosis includes  Fracture, dislocation, cauda equina,  metastasis Additional history obtained: Records reviewed Care Everywhere/External Records  Assessment and management:   Hemodynamically stable, nontoxic-appearing patient presenting with complaints of low back pain.  No injury or trauma.  Pain started within the past few days.  States she has a history of degenerative disc disease, and will have intermittent symptoms.  Notes that the symptoms feel similar but she has never had such extreme pain in this location.  On exam she does have  left sided paraspinal tenderness but exquisitely tender over left iliac crest.  No overlying wounds.  No saddle anesthesia.  5 out of 5 lower extremity strength.  Positive straight leg raise.  No red flag symptoms.  She is able to ambulate albeit some discomfort.  Overall most consistent with musculoskeletal etiology.  Independent ECG interpretation:  none  Independent labs interpretation:  The following labs were independently interpreted:  none  Independent visualization and interpretation of imaging: I independently visualized the following imaging with scope of interpretation limited to determining acute life threatening conditions related to emergency care: Pelvic x-ray with no acute findings.   Consultations obtained:   none  Disposition:   Patient will be discharged home. The patient has been appropriately medically screened and/or stabilized in the ED. I have low suspicion for any other emergent medical condition which would require further screening, evaluation or treatment in the ED or require inpatient management. At time of discharge the patient is hemodynamically stable and in no acute distress. I have discussed work-up results and diagnosis with patient and answered all questions. Patient is agreeable with discharge plan. We discussed strict return precautions for returning to the emergency department and they verbalized understanding.     Social Determinants of Health:   Patient's impaired  access to primary care  increases the complexity of managing their presentation  This note was dictated with voice recognition software.  Despite best efforts at proofreading, errors may have occurred which can change the documentation meaning.       Final diagnoses:  Chronic left-sided low back pain without sciatica    ED Discharge Orders     None          Donnajean Lynwood VEAR DEVONNA 04/21/24 1805    Randol Simmonds, MD 04/22/24 1126
